# Patient Record
Sex: Male | Born: 1945
Health system: Southern US, Community
[De-identification: ages and names within clinical notes are randomized; demographics above are authoritative.]

## PROBLEM LIST (undated history)

## (undated) DIAGNOSIS — R001 Bradycardia, unspecified: Secondary | ICD-10-CM

## (undated) DIAGNOSIS — K219 Gastro-esophageal reflux disease without esophagitis: Secondary | ICD-10-CM

## (undated) DIAGNOSIS — N200 Calculus of kidney: Secondary | ICD-10-CM

## (undated) DIAGNOSIS — E785 Hyperlipidemia, unspecified: Secondary | ICD-10-CM

## (undated) DIAGNOSIS — F528 Other sexual dysfunction not due to a substance or known physiological condition: Secondary | ICD-10-CM

## (undated) DIAGNOSIS — I10 Essential (primary) hypertension: Secondary | ICD-10-CM

## (undated) DIAGNOSIS — M5030 Other cervical disc degeneration, unspecified cervical region: Secondary | ICD-10-CM

## (undated) HISTORY — DX: Calculus of kidney: N20.0

## (undated) HISTORY — PX: FINGER SURGERY: SHX640

## (undated) HISTORY — PX: HERNIA REPAIR: SHX51

## (undated) HISTORY — DX: Other sexual dysfunction not due to a substance or known physiological condition: F52.8

## (undated) HISTORY — DX: Other cervical disc degeneration, unspecified cervical region: M50.30

## (undated) HISTORY — PX: KNEE ARTHROSCOPY: SUR90

## (undated) HISTORY — DX: Bradycardia, unspecified: R00.1

## (undated) HISTORY — DX: Hyperlipidemia, unspecified: E78.5

## (undated) HISTORY — PX: BELPHAROPTOSIS REPAIR: SHX369

## (undated) HISTORY — DX: Gastro-esophageal reflux disease without esophagitis: K21.9

---

## 2001-10-29 ENCOUNTER — Ambulatory Visit (HOSPITAL_BASED_OUTPATIENT_CLINIC_OR_DEPARTMENT_OTHER): Admission: RE | Admit: 2001-10-29 | Discharge: 2001-10-29 | Payer: Self-pay | Admitting: Internal Medicine

## 2003-05-22 HISTORY — PX: COLONOSCOPY: SHX174

## 2004-10-18 ENCOUNTER — Ambulatory Visit: Payer: Self-pay | Admitting: Family Medicine

## 2004-11-06 ENCOUNTER — Ambulatory Visit: Payer: Self-pay | Admitting: Family Medicine

## 2005-10-25 ENCOUNTER — Ambulatory Visit: Payer: Self-pay | Admitting: Family Medicine

## 2005-11-01 ENCOUNTER — Ambulatory Visit: Payer: Self-pay | Admitting: Family Medicine

## 2006-10-29 ENCOUNTER — Ambulatory Visit: Payer: Self-pay | Admitting: Family Medicine

## 2006-10-29 LAB — CONVERTED CEMR LAB
AST: 23 units/L (ref 0–37)
Albumin: 3.9 g/dL (ref 3.5–5.2)
Alkaline Phosphatase: 54 units/L (ref 39–117)
BUN: 15 mg/dL (ref 6–23)
Basophils Absolute: 0 10*3/uL (ref 0.0–0.1)
Basophils Relative: 0.2 % (ref 0.0–1.0)
CO2: 29 meq/L (ref 19–32)
Chloride: 109 meq/L (ref 96–112)
Creatinine, Ser: 1.1 mg/dL (ref 0.4–1.5)
HCT: 40.5 % (ref 39.0–52.0)
Hemoglobin: 14.6 g/dL (ref 13.0–17.0)
LDL Cholesterol: 127 mg/dL — ABNORMAL HIGH (ref 0–99)
MCHC: 35.9 g/dL (ref 30.0–36.0)
Monocytes Absolute: 0.7 10*3/uL (ref 0.2–0.7)
Neutrophils Relative %: 50.4 % (ref 43.0–77.0)
Potassium: 4.3 meq/L (ref 3.5–5.1)
RBC: 4.39 M/uL (ref 4.22–5.81)
RDW: 12.1 % (ref 11.5–14.6)
Sodium: 145 meq/L (ref 135–145)
Total Bilirubin: 0.8 mg/dL (ref 0.3–1.2)
Total CHOL/HDL Ratio: 4.4
Total Protein: 6.9 g/dL (ref 6.0–8.3)
VLDL: 17 mg/dL (ref 0–40)

## 2006-11-08 ENCOUNTER — Ambulatory Visit: Payer: Self-pay | Admitting: Family Medicine

## 2007-03-25 ENCOUNTER — Telehealth: Payer: Self-pay | Admitting: Family Medicine

## 2007-03-26 ENCOUNTER — Telehealth: Payer: Self-pay | Admitting: Family Medicine

## 2007-11-07 ENCOUNTER — Ambulatory Visit: Payer: Self-pay | Admitting: Family Medicine

## 2007-11-07 LAB — CONVERTED CEMR LAB
ALT: 33 units/L (ref 0–53)
Alkaline Phosphatase: 52 units/L (ref 39–117)
Basophils Absolute: 0 10*3/uL (ref 0.0–0.1)
Bilirubin Urine: NEGATIVE
Bilirubin, Direct: 0.1 mg/dL (ref 0.0–0.3)
CO2: 30 meq/L (ref 19–32)
Calcium: 9 mg/dL (ref 8.4–10.5)
Chloride: 105 meq/L (ref 96–112)
Cholesterol: 162 mg/dL (ref 0–200)
HDL: 37.1 mg/dL — ABNORMAL LOW (ref >39.0)
Ketones, urine, test strip: NEGATIVE
LDL Cholesterol: 103 mg/dL — ABNORMAL HIGH (ref 0–99)
Lymphocytes Relative: 35.2 % (ref 12.0–46.0)
MCHC: 34.5 g/dL (ref 30.0–36.0)
Neutro Abs: 3 10*3/uL (ref 1.4–7.7)
Neutrophils Relative %: 52.2 % (ref 43.0–77.0)
Nitrite: NEGATIVE
Platelets: 252 10*3/uL (ref 150–400)
Potassium: 4.2 meq/L (ref 3.5–5.1)
Protein, U semiquant: NEGATIVE
RDW: 12 % (ref 11.5–14.6)
Sodium: 141 meq/L (ref 135–145)
TSH: 3.03 microintl units/mL (ref 0.35–5.50)
Total Bilirubin: 0.9 mg/dL (ref 0.3–1.2)
Total CHOL/HDL Ratio: 4.4
Triglycerides: 108 mg/dL (ref 0–149)
Urobilinogen, UA: 0.2
VLDL: 22 mg/dL (ref 0–40)

## 2007-11-12 ENCOUNTER — Encounter: Payer: Self-pay | Admitting: *Deleted

## 2007-11-13 ENCOUNTER — Encounter: Payer: Self-pay | Admitting: Family Medicine

## 2007-11-14 ENCOUNTER — Ambulatory Visit: Payer: Self-pay | Admitting: Family Medicine

## 2007-11-14 DIAGNOSIS — F528 Other sexual dysfunction not due to a substance or known physiological condition: Secondary | ICD-10-CM

## 2007-11-14 DIAGNOSIS — E785 Hyperlipidemia, unspecified: Secondary | ICD-10-CM | POA: Insufficient documentation

## 2007-11-14 HISTORY — DX: Hyperlipidemia, unspecified: E78.5

## 2007-11-14 HISTORY — DX: Other sexual dysfunction not due to a substance or known physiological condition: F52.8

## 2008-11-05 ENCOUNTER — Ambulatory Visit: Payer: Self-pay | Admitting: Family Medicine

## 2008-11-05 LAB — CONVERTED CEMR LAB
Albumin: 3.9 g/dL (ref 3.5–5.2)
Alkaline Phosphatase: 57 units/L (ref 39–117)
Basophils Absolute: 0 10*3/uL (ref 0.0–0.1)
Bilirubin, Direct: 0.1 mg/dL (ref 0.0–0.3)
Blood in Urine, dipstick: NEGATIVE
CO2: 29 meq/L (ref 19–32)
Calcium: 8.8 mg/dL (ref 8.4–10.5)
Creatinine, Ser: 1 mg/dL (ref 0.4–1.5)
Eosinophils Absolute: 0.1 10*3/uL (ref 0.0–0.7)
Glucose, Bld: 104 mg/dL — ABNORMAL HIGH (ref 70–99)
Glucose, Urine, Semiquant: NEGATIVE
HDL: 41.1 mg/dL (ref >39.00)
Ketones, urine, test strip: NEGATIVE
Lymphocytes Relative: 37.9 % (ref 12.0–46.0)
MCHC: 35.3 g/dL (ref 30.0–36.0)
Neutrophils Relative %: 48.7 % (ref 43.0–77.0)
Nitrite: NEGATIVE
Platelets: 217 10*3/uL (ref 150.0–400.0)
RDW: 12.1 % (ref 11.5–14.6)
Specific Gravity, Urine: 1.025
Triglycerides: 84 mg/dL (ref 0.0–149.0)
pH: 5

## 2008-11-15 ENCOUNTER — Ambulatory Visit: Payer: Self-pay | Admitting: Family Medicine

## 2008-11-15 DIAGNOSIS — M5030 Other cervical disc degeneration, unspecified cervical region: Secondary | ICD-10-CM

## 2008-11-15 DIAGNOSIS — R351 Nocturia: Secondary | ICD-10-CM | POA: Insufficient documentation

## 2008-11-15 HISTORY — DX: Other cervical disc degeneration, unspecified cervical region: M50.30

## 2009-11-09 ENCOUNTER — Ambulatory Visit: Payer: Self-pay | Admitting: Family Medicine

## 2009-11-09 LAB — CONVERTED CEMR LAB
Albumin: 3.8 g/dL (ref 3.5–5.2)
BUN: 13 mg/dL (ref 6–23)
Basophils Absolute: 0 10*3/uL (ref 0.0–0.1)
Bilirubin Urine: NEGATIVE
Blood in Urine, dipstick: NEGATIVE
CO2: 30 meq/L (ref 19–32)
Chloride: 109 meq/L (ref 96–112)
Cholesterol: 145 mg/dL (ref 0–200)
Eosinophils Absolute: 0.2 10*3/uL (ref 0.0–0.7)
GFR calc non Af Amer: 76.56 mL/min (ref >60)
Glucose, Bld: 100 mg/dL — ABNORMAL HIGH (ref 70–99)
Glucose, Urine, Semiquant: NEGATIVE
HCT: 38.5 % — ABNORMAL LOW (ref 39.0–52.0)
Lymphs Abs: 1.8 10*3/uL (ref 0.7–4.0)
MCHC: 35 g/dL (ref 30.0–36.0)
MCV: 95 fL (ref 78.0–100.0)
Monocytes Absolute: 0.7 10*3/uL (ref 0.1–1.0)
PSA: 0.45 ng/mL (ref 0.10–4.00)
Platelets: 248 10*3/uL (ref 150.0–400.0)
Potassium: 4.8 meq/L (ref 3.5–5.1)
Protein, U semiquant: NEGATIVE
RDW: 13.1 % (ref 11.5–14.6)
TSH: 3.39 microintl units/mL (ref 0.35–5.50)
Total Bilirubin: 0.8 mg/dL (ref 0.3–1.2)
VLDL: 20.6 mg/dL (ref 0.0–40.0)
pH: 5.5

## 2009-11-22 ENCOUNTER — Ambulatory Visit: Payer: Self-pay | Admitting: Family Medicine

## 2010-06-20 NOTE — Assessment & Plan Note (Signed)
Summary: CPX/NJR/PT [facility] FROM BMP/CJR   Vital Signs:  Patient profile:   65 year old male Height:      68.5 inches Weight:      222 pounds BMI:     33.38 Temp:     98.7 degrees F oral BP sitting:   130 / 90  (left arm) Cuff size:   regular  Vitals Entered By: Kern Reap CMA Duncan Dull) (November 22, 2009 2:17 PM) CC: cpx   CC:  cpx.  History of Present Illness: Michael Compton is a 65 year old, married male, nonsmoker, who comes in today for general physical examination and evaluation of hyperlipidemia and erectile dysfunction.  He is a history of hyperlipidemia, treated with Niaspan 500 mg daily and Zocor 40 mg nightly.  Fasting lipids are within goal with an LDL of 88.  He also has a history of erectile dysfunction for which he uses Viagra 100 mg p.r.n.  History 10 accurate dental care.  Colonoscopy and GI normal.  Tetanus 2005.  He recently had coxsackie viral infection  Allergies (verified): No Known Drug Allergies  Past History:  Past medical, surgical, family and social histories (including risk factors) reviewed, and no changes noted (except as noted below).  Past Medical History: Reviewed history from 11/14/2007 and no changes required. Hyperlipidemia right knee torn cartilage.  Surgical repair hernia repair erectile dysfunction  Family History: Reviewed history from 11/14/2007 and no changes required. father died 70, bladder cancer.  Smoker.  Mother is in his 30s alive and well.  Has had a total knee replacement otherwise in excellent health.  Two brothers one sister all 3 in excellent health  Social History: Reviewed history from 11/14/2007 and no changes required. 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 No apparent alopecia or balding. Eyes:  No corneal or conjunctival inflammation noted. EOMI. Perrla. Funduscopic exam benign, without hemorrhages, exudates or papilledema. Vision grossly normal. Ears:  External ear exam shows no significant lesions or deformities.  Otoscopic examination reveals clear canals, tympanic membranes are intact bilaterally without bulging, retraction, inflammation or discharge. Hearing is grossly normal bilaterally. Nose:  External nasal examination shows no deformity or inflammation. Nasal mucosa are pink and moist without lesions or exudates. Mouth:  Oral mucosa and oropharynx without lesions or exudates.  Teeth in good repair. Neck:  No deformities, masses, or tenderness noted. Chest Wall:  No deformities, masses, tenderness or gynecomastia noted. Breasts:  No masses or gynecomastia noted Lungs:  Normal respiratory effort, chest expands symmetrically. Lungs are clear to auscultation, no crackles or wheezes. Heart:  Normal rate and regular rhythm. S1 and S2 normal without gallop, murmur, click, rub or other extra sounds. Abdomen:  Bowel sounds positive,abdomen soft and non-tender without masses, organomegaly or hernias noted. Rectal:  No external abnormalities noted. Normal sphincter tone. No rectal masses or tenderness. Genitalia:  Testes bilaterally descended without nodularity, tenderness or masses. No scrotal masses or lesions. No penis lesions or urethral discharge. Prostate:  Prostate gland firm and smooth, no enlargement, nodularity, tenderness, mass, asymmetry or induration. Msk:  No deformity or scoliosis noted of thoracic or lumbar spine.   Pulses:  R and L carotid,radial,femoral,dorsalis pedis and posterior tibial pulses are full and equal bilaterally Extremities:  No clubbing, cyanosis, edema, or deformity noted with normal  full range of motion of all joints.   Neurologic:  No cranial nerve deficits noted. Station and  gait are normal. Plantar reflexes are down-going bilaterally. DTRs are symmetrical throughout. Sensory, motor and coordinative functions appear intact. Skin:  Intact without suspicious lesions or rashes Cervical Nodes:  No lymphadenopathy noted Axillary Nodes:  No palpable lymphadenopathy Inguinal Nodes:  No significant adenopathy Psych:  Cognition and judgment appear intact. Alert and cooperative with normal attention span and concentration. No apparent delusions, illusions, hallucinations   Impression & Recommendations:  Problem # 1:  ERECTILE DYSFUNCTION (ICD-302.72) Assessment Improved  His updated medication list for this problem includes:    Viagra 100 Mg Tabs (Sildenafil citrate) ..... Uad  Orders: Prescription Created Electronically [id number])  Problem # 2:  HYPERLIPIDEMIA (ICD-272.4) Assessment: Improved  His updated medication list for this problem includes:    Niaspan 500 Mg Tbcr (Niacin (antihyperlipidemic)) .[name]... 1 qam    Zocor 40 Mg Tabs (Simvastatin) .[name]... 1 tablet by mouth every night  Orders: Prescription Created Electronically [id number]) EKG w/ Interpretation (93000)  Problem # 3:  PHYSICAL EXAMINATION (ICD-V70.0) Assessment: Unchanged  Orders: Prescription Created Electronically [phone]) EKG w/ Interpretation (93000)  Complete Medication List: 1)  Niaspan 500 Mg Tbcr (Niacin (antihyperlipidemic)) .[name].. 1 qam 2)  Zocor 40 Mg Tabs (Simvastatin) .[name].. 1 tablet by mouth every night 3)  Multivitamins Tabs (Multiple vitamin) .... Once daily 4)  Adult Aspirin Ec Low Strength 81 Mg Tbec (Aspirin) .... Once daily 5)  L-lysine 500 Mg Tabs (Lysine) .... Once daily 6)  Viagra 100 Mg Tabs (Sildenafil citrate) .... Uad 7)  Omega-3 350 Mg Caps (Omega-3 fatty acids) .... Take one tab by mouth two times a day 8)  Glucosamine Sulfate 750 Mg Caps (Glucosamine sulfate) .... Take one tab by mouth two times a day  Patient Instructions: 1)  continue current medications.  Follow up  in one year Prescriptions: VIAGRA 100 MG  TABS (SILDENAFIL CITRATE) UAD  #6 x 11   Entered and Authorized by:   Roderick Pee MD   Signed by:   Roderick Pee MD on 11/22/2009   Method used:   Electronically to        [facility] [address] [phone]* (retail)       [address]       [place], [place]  [address]       Ph: [phone] or [phone]       Fax: [phone]   RxID:   [id number] ZOCOR 40 MG TABS (SIMVASTATIN) 1 tablet by mouth every night  #100 x 3   Entered and Authorized by:   Roderick Pee MD   Signed by:   Roderick Pee MD on 11/22/2009   Method used:   [facility] to        [facility] [address] [phone]* (retail)       [address]       [place], [place]  [address]       Ph: [phone] or [phone]       Fax: [phone]   RxID:   [id number] NIASPAN 500 MG  TBCR (NIACIN (ANTIHYPERLIPIDEMIC)) 1 qam  #100 Tablet x 3   Entered and Authorized by:   Roderick Pee MD   Signed by:   Roderick Pee MD on 11/22/2009   Method used:   [facility]ronically to  [facility] [address] [phone]* (retail)       [address]       [place], [place]  [address]       Ph: [phone] or [phone]       Fax: [phone]   RxID:   [id number]    Immunization History:  Tetanus/Td Immunization History:    Tetanus/Td:  historical (05/22/2003)

## 2010-06-22 ENCOUNTER — Encounter: Payer: Self-pay | Admitting: Family Medicine

## 2010-06-22 ENCOUNTER — Ambulatory Visit (INDEPENDENT_AMBULATORY_CARE_PROVIDER_SITE_OTHER): Payer: BC Managed Care – PPO | Admitting: Family Medicine

## 2010-06-22 VITALS — BP 122/84 | Temp 98.1°F | Ht 69.25 in | Wt 232.0 lb

## 2010-06-22 DIAGNOSIS — M79609 Pain in unspecified limb: Secondary | ICD-10-CM

## 2010-06-22 DIAGNOSIS — G620 Drug-induced polyneuropathy: Secondary | ICD-10-CM

## 2010-06-22 NOTE — Patient Instructions (Signed)
Stop the simvastatin, and take Motrin 600 mg twice daily with food.  If in two to 3 weeks.  He don't see any improvement.  Call

## 2010-06-22 NOTE — Progress Notes (Signed)
  Subjective:    Patient ID: Michael Compton, male    DOB: 04/01/1946, 65 y.o.   MRN: [id number]  HPI Michael Compton is a 65 -year-old, married male, nonsmoker, who comes in today for a 7 month history of bilateral lower extremity pain.  He states about 7 months ago he began having pain in his lower extremities.  He describes as a constant ache.  On a scale of one to 10 at the 4.  He also has two separate problems that are really not related.  One was plantar fasciitis, which resolved with symptomatic therapy, Motrin, stretching, etc. The other is he describes a syndrome of restless leg syndrome.  This been going on for many years.  He is otherwise in good health.     Review of Systems    musculoskeletal review of systems negative Objective:   Physical Exam    well-developed well-nourished, white male, in no acute distress.  Examination of both lower extremities shows the skin to be normal.  Pulses are normal.  Sensation reflexes, muscle strength, normal.  No palpable tenderness.    Assessment & Plan:

## 2010-06-23 ENCOUNTER — Encounter (INDEPENDENT_AMBULATORY_CARE_PROVIDER_SITE_OTHER): Payer: Self-pay | Admitting: *Deleted

## 2010-06-23 ENCOUNTER — Other Ambulatory Visit: Payer: BC Managed Care – PPO

## 2010-06-23 ENCOUNTER — Other Ambulatory Visit: Payer: Self-pay | Admitting: Family Medicine

## 2010-06-23 DIAGNOSIS — G620 Drug-induced polyneuropathy: Secondary | ICD-10-CM

## 2010-06-27 ENCOUNTER — Ambulatory Visit: Payer: Self-pay | Admitting: Family Medicine

## 2010-11-15 ENCOUNTER — Other Ambulatory Visit (INDEPENDENT_AMBULATORY_CARE_PROVIDER_SITE_OTHER): Payer: BC Managed Care – PPO

## 2010-11-15 DIAGNOSIS — Z0000 Encounter for general adult medical examination without abnormal findings: Secondary | ICD-10-CM

## 2010-11-15 LAB — BASIC METABOLIC PANEL
BUN: 14 mg/dL (ref 6–23)
Calcium: 8.6 mg/dL (ref 8.4–10.5)
GFR: 80.78 mL/min (ref >60.00)
Glucose, Bld: 96 mg/dL (ref 70–99)

## 2010-11-15 LAB — CBC WITH DIFFERENTIAL/PLATELET
Basophils Absolute: 0 10*3/uL (ref 0.0–0.1)
Lymphocytes Relative: 25.1 % (ref 12.0–46.0)
Monocytes Relative: 9.9 % (ref 3.0–12.0)
Neutrophils Relative %: 63.9 % (ref 43.0–77.0)
Platelets: 264 10*3/uL (ref 150.0–400.0)
RDW: 13 % (ref 11.5–14.6)
WBC: 6.8 10*3/uL (ref 4.5–10.5)

## 2010-11-15 LAB — TSH: TSH: 2.69 u[IU]/mL (ref 0.35–5.50)

## 2010-11-15 LAB — POCT URINALYSIS DIPSTICK
Blood, UA: NEGATIVE
Glucose, UA: NEGATIVE
Nitrite, UA: NEGATIVE
Urobilinogen, UA: 0.2

## 2010-11-15 LAB — HEPATIC FUNCTION PANEL
AST: 21 U/L (ref 0–37)
Alkaline Phosphatase: 63 U/L (ref 39–117)
Bilirubin, Direct: 0.1 mg/dL (ref 0.0–0.3)
Total Bilirubin: 0.7 mg/dL (ref 0.3–1.2)

## 2010-11-15 LAB — LIPID PANEL
Cholesterol: 203 mg/dL — ABNORMAL HIGH (ref 0–200)
Total CHOL/HDL Ratio: 5
VLDL: 16.6 mg/dL (ref 0.0–40.0)

## 2010-11-27 ENCOUNTER — Encounter: Payer: Self-pay | Admitting: Family Medicine

## 2010-11-27 ENCOUNTER — Ambulatory Visit (INDEPENDENT_AMBULATORY_CARE_PROVIDER_SITE_OTHER): Payer: BC Managed Care – PPO | Admitting: Family Medicine

## 2010-11-27 DIAGNOSIS — F528 Other sexual dysfunction not due to a substance or known physiological condition: Secondary | ICD-10-CM

## 2010-11-27 DIAGNOSIS — Z0000 Encounter for general adult medical examination without abnormal findings: Secondary | ICD-10-CM

## 2010-11-27 DIAGNOSIS — E785 Hyperlipidemia, unspecified: Secondary | ICD-10-CM

## 2010-11-27 DIAGNOSIS — M5030 Other cervical disc degeneration, unspecified cervical region: Secondary | ICD-10-CM

## 2010-11-27 MED ORDER — SILDENAFIL CITRATE 100 MG PO TABS: 100.0000 mg | ORAL_TABLET | Freq: Every day | ORAL | Status: DC | PRN

## 2010-11-27 NOTE — Patient Instructions (Signed)
Continue your current medications.  Follow-up in one year, sooner if any problems.  We do recommend the shingles of vaccination

## 2010-11-27 NOTE — Progress Notes (Signed)
  Subjective:    Patient ID: Michael Compton, male    DOB: 09-24-1945, 65 y.o.   MRN: [id number]  HPI Michael Compton is a 65 year old, married man nonsmoker, who comes in today for general physical examination because of a history of hyperlipidemia erectile dysfunction and cervical disk disease.  His hyperlipidemia street with diet, exercise, and Niaspan 500 mg daily.  Lipids are at goal.  We stopped the Zocor because of side effects.  He's been off and that now for 6 months.  He uses Viagra p.r.n. For ED.  Currently seeing your to pedis or lumbar spine pain.  He is on Mobic 7.5 mg daily.  He gets routine eye care, dental care, colonoscopy, 2005, normal, tetanus, 05, information given on shingles.  This year.  He had lid surgery, right, left eye for lid lag, recovered.  No sequelae   Review of Systems  Constitutional: Negative.   HENT: Negative.   Eyes: Negative.   Respiratory: Negative.   Cardiovascular: Negative.   Gastrointestinal: Negative.   Genitourinary: Negative.   Musculoskeletal: Negative.   Skin: Negative.   Neurological: Negative.   Hematological: Negative.   Psychiatric/Behavioral: Negative.        Objective:   Physical Exam  Constitutional: He is oriented to person, place, and time. He appears well-developed and well-nourished.  HENT:  Head: Normocephalic and atraumatic.  Right Ear: External ear normal.  Left Ear: External ear normal.  Nose: Nose normal.  Mouth/Throat: Oropharynx is clear and moist.  Eyes: Conjunctivae and EOM are normal. Pupils are equal, round, and reactive to light.  Neck: Normal range of motion. Neck supple. No JVD present. No tracheal deviation present. No thyromegaly present.  Cardiovascular: Normal rate, regular rhythm, normal heart sounds and intact distal pulses.  Exam reveals no gallop and no friction rub.   No murmur heard. Pulmonary/Chest: Effort normal and breath sounds normal. No stridor. No respiratory distress. He has no wheezes. He  has no rales. He exhibits no tenderness.  Abdominal: Soft. Bowel sounds are normal. He exhibits no distension and no mass. There is no tenderness. There is no rebound and no guarding.  Genitourinary: Rectum normal, prostate normal and penis normal. Guaiac negative stool. No penile tenderness.  Musculoskeletal: Normal range of motion. He exhibits no edema and no tenderness.  Lymphadenopathy:    He has no cervical adenopathy.  Neurological: He is alert and oriented to person, place, and time. He has normal reflexes. No cranial nerve deficit. He exhibits normal muscle tone.  Skin: Skin is warm and dry. No rash noted. No erythema. No pallor.       Well-healed scars right and left upper eyes from previous surgery and may  Psychiatric: He has a normal mood and affect. His behavior is normal. Judgment and thought content normal.          Assessment & Plan:  Healthy male.  Hyperlipidemia controlled with diet, exercise, and Niaspan continue that option.  Erectile dysfunction continue Viagra p.r.n.  Return one year, sooner if any problems.  Recommend shingles

## 2010-12-25 ENCOUNTER — Ambulatory Visit (INDEPENDENT_AMBULATORY_CARE_PROVIDER_SITE_OTHER): Payer: BC Managed Care – PPO | Admitting: Family Medicine

## 2010-12-25 DIAGNOSIS — Z23 Encounter for immunization: Secondary | ICD-10-CM

## 2010-12-25 DIAGNOSIS — Z0000 Encounter for general adult medical examination without abnormal findings: Secondary | ICD-10-CM

## 2011-06-28 DIAGNOSIS — H25099 Other age-related incipient cataract, unspecified eye: Secondary | ICD-10-CM | POA: Diagnosis not present

## 2011-08-28 DIAGNOSIS — J00 Acute nasopharyngitis [common cold]: Secondary | ICD-10-CM | POA: Diagnosis not present

## 2011-08-28 DIAGNOSIS — J209 Acute bronchitis, unspecified: Secondary | ICD-10-CM | POA: Diagnosis not present

## 2011-08-29 DIAGNOSIS — T1500XA Foreign body in cornea, unspecified eye, initial encounter: Secondary | ICD-10-CM | POA: Diagnosis not present

## 2011-10-25 ENCOUNTER — Telehealth: Payer: Self-pay | Admitting: Family Medicine

## 2011-10-25 DIAGNOSIS — E785 Hyperlipidemia, unspecified: Secondary | ICD-10-CM

## 2011-10-25 DIAGNOSIS — F528 Other sexual dysfunction not due to a substance or known physiological condition: Secondary | ICD-10-CM

## 2011-10-25 DIAGNOSIS — Z0000 Encounter for general adult medical examination without abnormal findings: Secondary | ICD-10-CM

## 2011-10-25 NOTE — Telephone Encounter (Signed)
Orders entered

## 2011-10-25 NOTE — Telephone Encounter (Signed)
This pt is going to [facility] lab tomorrow, 6/7 for CPX labs. Can you please put the orders in? Thanks!!

## 2011-10-26 ENCOUNTER — Other Ambulatory Visit (INDEPENDENT_AMBULATORY_CARE_PROVIDER_SITE_OTHER): Payer: BC Managed Care – PPO

## 2011-10-26 DIAGNOSIS — F528 Other sexual dysfunction not due to a substance or known physiological condition: Secondary | ICD-10-CM | POA: Diagnosis not present

## 2011-10-26 DIAGNOSIS — Z0000 Encounter for general adult medical examination without abnormal findings: Secondary | ICD-10-CM

## 2011-10-26 DIAGNOSIS — E785 Hyperlipidemia, unspecified: Secondary | ICD-10-CM | POA: Diagnosis not present

## 2011-10-26 LAB — LIPID PANEL
Cholesterol: 208 mg/dL — ABNORMAL HIGH (ref 0–200)
HDL: 43.3 mg/dL (ref >39.00)
Total CHOL/HDL Ratio: 5
Triglycerides: 75 mg/dL (ref 0.0–149.0)

## 2011-10-26 LAB — CBC WITH DIFFERENTIAL/PLATELET
Basophils Absolute: 0 10*3/uL (ref 0.0–0.1)
Eosinophils Absolute: 0.1 10*3/uL (ref 0.0–0.7)
HCT: 42.7 % (ref 39.0–52.0)
Lymphs Abs: 2 10*3/uL (ref 0.7–4.0)
MCV: 94.5 fl (ref 78.0–100.0)
Monocytes Absolute: 0.6 10*3/uL (ref 0.1–1.0)
Platelets: 255 10*3/uL (ref 150.0–400.0)
RDW: 12.9 % (ref 11.5–14.6)

## 2011-10-26 LAB — TSH: TSH: 2.89 u[IU]/mL (ref 0.35–5.50)

## 2011-10-26 LAB — BASIC METABOLIC PANEL
Calcium: 9 mg/dL (ref 8.4–10.5)
Chloride: 109 mEq/L (ref 96–112)
Creatinine, Ser: 0.9 mg/dL (ref 0.4–1.5)
Sodium: 140 mEq/L (ref 135–145)

## 2011-10-26 LAB — URINALYSIS
Hgb urine dipstick: NEGATIVE
Total Protein, Urine: NEGATIVE
Urine Glucose: NEGATIVE

## 2011-10-26 LAB — PSA: PSA: 0.7 ng/mL (ref 0.10–4.00)

## 2011-10-26 LAB — HEPATIC FUNCTION PANEL
ALT: 19 U/L (ref 0–53)
AST: 23 U/L (ref 0–37)
Total Bilirubin: 0.8 mg/dL (ref 0.3–1.2)

## 2011-10-29 ENCOUNTER — Other Ambulatory Visit: Payer: BC Managed Care – PPO

## 2011-11-14 ENCOUNTER — Encounter: Payer: Self-pay | Admitting: Family Medicine

## 2011-11-14 ENCOUNTER — Ambulatory Visit (INDEPENDENT_AMBULATORY_CARE_PROVIDER_SITE_OTHER): Payer: BC Managed Care – PPO | Admitting: Family Medicine

## 2011-11-14 VITALS — BP 130/80 | Temp 98.5°F | Ht 68.5 in | Wt 227.0 lb

## 2011-11-14 DIAGNOSIS — E785 Hyperlipidemia, unspecified: Secondary | ICD-10-CM

## 2011-11-14 DIAGNOSIS — Z23 Encounter for immunization: Secondary | ICD-10-CM

## 2011-11-14 DIAGNOSIS — Z0000 Encounter for general adult medical examination without abnormal findings: Secondary | ICD-10-CM

## 2011-11-14 DIAGNOSIS — F528 Other sexual dysfunction not due to a substance or known physiological condition: Secondary | ICD-10-CM

## 2011-11-14 DIAGNOSIS — M5030 Other cervical disc degeneration, unspecified cervical region: Secondary | ICD-10-CM

## 2011-11-14 MED ORDER — MELOXICAM 7.5 MG PO TABS: 7.5000 mg | ORAL_TABLET | Freq: Every day | ORAL | Status: DC

## 2011-11-14 MED ORDER — SILDENAFIL CITRATE 100 MG PO TABS: 100.0000 mg | ORAL_TABLET | Freq: Every day | ORAL | Status: DC | PRN

## 2011-11-14 NOTE — Patient Instructions (Signed)
Continue your current medications  Followup in 1 year sooner if any problems 

## 2011-11-14 NOTE — Progress Notes (Signed)
  Subjective:    Patient ID: Michael Compton, male    DOB: 07/25/1945, 66 y.o.   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   HENT: Negative.   Eyes: Negative.   Respiratory: Negative.   Cardiovascular: Negative.   Gastrointestinal: Negative.   Genitourinary: Negative.   Musculoskeletal: Negative.   Skin: Negative.   Neurological: Negative.   Hematological: Negative.   Psychiatric/Behavioral: Negative.        Objective:   Physical Exam  Constitutional: He is oriented to person, place, and time. He appears well-developed and well-nourished.  HENT:  Head: Normocephalic and atraumatic.  Right Ear: External ear normal.  Left Ear: External ear normal.  Nose: Nose normal.  Mouth/Throat: Oropharynx is clear and moist.  Eyes: Conjunctivae and EOM are normal. Pupils are equal, round, and reactive to light.  Neck: Normal range of motion. Neck supple. No JVD present. No tracheal deviation present. No thyromegaly present.  Cardiovascular: Normal rate, regular rhythm, normal heart sounds and intact distal pulses.  Exam reveals no gallop and no friction rub.   No murmur heard. Pulmonary/Chest: Effort normal and breath sounds normal. No stridor. No respiratory distress. He has no wheezes. He has no rales. He exhibits no tenderness.  Abdominal: Soft. Bowel sounds are normal. He exhibits no  distension and no mass. There is no tenderness. There is no rebound and no guarding.  Genitourinary: Rectum normal, prostate normal and penis normal. Guaiac negative stool. No penile tenderness.  Musculoskeletal: Normal range of motion. He exhibits no edema and no tenderness.  Lymphadenopathy:    He has no cervical adenopathy.  Neurological: He is alert and oriented to person, place, and time. He has normal reflexes. No cranial nerve deficit. He exhibits normal muscle tone.  Skin: Skin is warm and dry. No rash noted. No erythema. No pallor.  Psychiatric: He has a normal mood and affect. His behavior is normal. Judgment and thought content normal.          Assessment & Plan:  Healthy male  Hyperlipidemia continue Niaspan  Osteoarthritis continue Mobic  Erectile dysfunction continue Viagra

## 2012-11-17 ENCOUNTER — Ambulatory Visit (INDEPENDENT_AMBULATORY_CARE_PROVIDER_SITE_OTHER): Payer: Medicare Other | Admitting: Family Medicine

## 2012-11-17 ENCOUNTER — Encounter: Payer: Self-pay | Admitting: Family Medicine

## 2012-11-17 VITALS — BP 140/90 | Temp 99.0°F | Ht 69.5 in | Wt 228.0 lb

## 2012-11-17 DIAGNOSIS — E785 Hyperlipidemia, unspecified: Secondary | ICD-10-CM | POA: Diagnosis not present

## 2012-11-17 DIAGNOSIS — N401 Enlarged prostate with lower urinary tract symptoms: Secondary | ICD-10-CM

## 2012-11-17 DIAGNOSIS — Z0000 Encounter for general adult medical examination without abnormal findings: Secondary | ICD-10-CM | POA: Diagnosis not present

## 2012-11-17 DIAGNOSIS — F528 Other sexual dysfunction not due to a substance or known physiological condition: Secondary | ICD-10-CM | POA: Diagnosis not present

## 2012-11-17 LAB — CBC WITH DIFFERENTIAL/PLATELET
Basophils Relative: 0.4 % (ref 0.0–3.0)
Eosinophils Relative: 1.1 % (ref 0.0–5.0)
HCT: 41.3 % (ref 39.0–52.0)
Hemoglobin: 14.1 g/dL (ref 13.0–17.0)
Lymphs Abs: 1.9 10*3/uL (ref 0.7–4.0)
Monocytes Relative: 10.9 % (ref 3.0–12.0)
Neutro Abs: 3.4 10*3/uL (ref 1.4–7.7)
RDW: 12.8 % (ref 11.5–14.6)
WBC: 6.1 10*3/uL (ref 4.5–10.5)

## 2012-11-17 LAB — PSA: PSA: 0.69 ng/mL (ref 0.10–4.00)

## 2012-11-17 LAB — HEPATIC FUNCTION PANEL
AST: 24 U/L (ref 0–37)
Total Bilirubin: 0.8 mg/dL (ref 0.3–1.2)

## 2012-11-17 LAB — POCT URINALYSIS DIPSTICK
Bilirubin, UA: NEGATIVE
Blood, UA: NEGATIVE
Ketones, UA: NEGATIVE
Spec Grav, UA: >=1.03
pH, UA: 5

## 2012-11-17 LAB — LIPID PANEL
Total CHOL/HDL Ratio: 5
Triglycerides: 99 mg/dL (ref 0.0–149.0)

## 2012-11-17 LAB — LDL CHOLESTEROL, DIRECT: Direct LDL: 167 mg/dL

## 2012-11-17 LAB — BASIC METABOLIC PANEL
BUN: 18 mg/dL (ref 6–23)
Chloride: 109 mEq/L (ref 96–112)
Glucose, Bld: 121 mg/dL — ABNORMAL HIGH (ref 70–99)
Potassium: 5.2 mEq/L — ABNORMAL HIGH (ref 3.5–5.1)

## 2012-11-17 MED ORDER — SILDENAFIL CITRATE 100 MG PO TABS: 100.0000 mg | ORAL_TABLET | Freq: Every day | ORAL | Status: DC | PRN

## 2012-11-17 NOTE — Progress Notes (Signed)
  Subjective:    Patient ID: Michael Compton, male    DOB: 1946-02-20, 67 y.o.   MRN: [id number]  HPI [name] is a 67 year old married male does not smoke cigarettes,,,,,, smokes 2 cigars when he is playing a round of golf,,,,,, who comes in today for a Medicare wellness examination because of a history of hyperlipidemia and erectile dysfunction and a new problem of reflux esophagitis  He's had reflux esophagitis for the past 2 years. He says it doesn't bother him at night. During the day he can even do something as simple as walk his dog and he feels a burning sensation in his mid sternal area. He denies any cardiac symptoms. He has no difficulty swallowing. He's tried over-the-counter medications to no avail.  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   HENT: Negative.   Eyes: Negative.   Respiratory: Negative.   Cardiovascular: Negative.   Gastrointestinal: Negative.   Genitourinary: Negative.   Musculoskeletal: Negative.   Skin: Negative.   Neurological: Negative.   Psychiatric/Behavioral: Negative.        Objective:   Physical Exam  Constitutional: He is oriented to person, place, and time. He appears well-developed and well-nourished.  HENT:  Head: Normocephalic and atraumatic.  Right Ear: External ear normal.  Left Ear: External ear normal.  Nose: Nose normal.  Mouth/Throat: Oropharynx is clear and moist.  Eyes: Conjunctivae and EOM are normal. Pupils are equal, round, and reactive to light.  Neck: Normal range of motion. Neck supple. No JVD present. No tracheal deviation  present. No thyromegaly present.  Cardiovascular: Normal rate, regular rhythm, normal heart sounds and intact distal pulses.  Exam reveals no gallop and no friction rub.   No murmur heard. Pulmonary/Chest: Effort normal and breath sounds normal. No stridor. No respiratory distress. He has no wheezes. He has no rales. He exhibits no tenderness.  Abdominal: Soft. Bowel sounds are normal. He exhibits no distension and no mass. There is no tenderness. There is no rebound and no guarding.  Genitourinary: Rectum normal, prostate normal and penis normal. Guaiac negative stool. No penile tenderness.  Musculoskeletal: Normal range of motion. He exhibits no edema and no tenderness.  Lymphadenopathy:    He has no cervical adenopathy.  Neurological: He is alert and oriented to person, place, and time. He has normal reflexes. No cranial nerve deficit. He exhibits normal muscle tone.  Skin: Skin is warm and dry. No rash noted. No erythema. No pallor.  Total body skin exam normal  Psychiatric: He has a normal mood and affect. His behavior is normal. Judgment and thought content normal.          Assessment & Plan:  Healthy male  Mild hyperlipidemia continue OTC niacin  Erectile dysfunction continue Viagra 100 mg when necessary    Reflux esophagitis is getting worse. We will place him on anti-reflex program if after 3-4 weeks we see no improvement or he has difficulty swallowing and we'll get him to GI for further evaluation

## 2012-11-17 NOTE — Patient Instructions (Signed)
For the reflux I would recommend the following............ No nicotine and caffeine peppermint alcohol or aspirin or aspirin products.  Only take Tylenol  OTC Prilosec,,,,,,,,,, one tablet twice daily  Followup in 4 weeks  [email or web address]

## 2012-12-09 ENCOUNTER — Telehealth: Payer: Self-pay | Admitting: Family Medicine

## 2012-12-09 DIAGNOSIS — E785 Hyperlipidemia, unspecified: Secondary | ICD-10-CM

## 2012-12-09 NOTE — Telephone Encounter (Signed)
Michael Compton, pt was on Dr Nelida Meuse schedule for next wk as a 15mo recheck on heartburn. He said it has cleared up. However, based on his 6/30 labs, Dr Tawanna Cooler wanted him to have repeat lipids in 3mos. We went ahead an set him up for lipid panel on 10/30 (can you put order in?) and then pt said if, based on those results, Dr Tawanna Cooler wants to see him, he can make OV appt then. Is this ok?

## 2012-12-09 NOTE — Telephone Encounter (Signed)
Yes.  Lab order placed.

## 2012-12-15 ENCOUNTER — Ambulatory Visit: Payer: Medicare Other | Admitting: Family Medicine

## 2013-02-17 ENCOUNTER — Other Ambulatory Visit (INDEPENDENT_AMBULATORY_CARE_PROVIDER_SITE_OTHER): Payer: Medicare Other

## 2013-02-17 DIAGNOSIS — E785 Hyperlipidemia, unspecified: Secondary | ICD-10-CM | POA: Diagnosis not present

## 2013-02-17 LAB — LDL CHOLESTEROL, DIRECT: Direct LDL: 153.9 mg/dL

## 2013-03-06 ENCOUNTER — Telehealth: Payer: Self-pay | Admitting: Family Medicine

## 2013-03-06 NOTE — Telephone Encounter (Signed)
Pt would like blood work results °

## 2013-03-10 ENCOUNTER — Encounter: Payer: Self-pay | Admitting: *Deleted

## 2013-03-10 NOTE — Telephone Encounter (Signed)
Patient is aware of lab results.

## 2013-03-26 ENCOUNTER — Other Ambulatory Visit: Payer: Self-pay

## 2013-04-21 DIAGNOSIS — J309 Allergic rhinitis, unspecified: Secondary | ICD-10-CM | POA: Diagnosis not present

## 2013-04-21 DIAGNOSIS — R05 Cough: Secondary | ICD-10-CM | POA: Diagnosis not present

## 2013-09-08 ENCOUNTER — Encounter: Payer: Self-pay | Admitting: Family Medicine

## 2013-09-08 ENCOUNTER — Ambulatory Visit (INDEPENDENT_AMBULATORY_CARE_PROVIDER_SITE_OTHER): Payer: Medicare Other | Admitting: Family Medicine

## 2013-09-08 DIAGNOSIS — L820 Inflamed seborrheic keratosis: Secondary | ICD-10-CM | POA: Diagnosis not present

## 2013-09-08 NOTE — Patient Instructions (Signed)
Return when necessary 

## 2013-09-08 NOTE — Progress Notes (Signed)
   Subjective:    Patient ID: Michael Compton, male    DOB: 12-22-1945, 68 y.o.   MRN: [id number]  HPI Michael Compton is a 68 year old male who comes in today with about a lesion on his right lower extremity  He has a crusty brown reason is right lower extremity is now as big as a quarter.  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

## 2013-10-21 ENCOUNTER — Encounter: Payer: Self-pay | Admitting: Internal Medicine

## 2013-10-30 DIAGNOSIS — M1710 Unilateral primary osteoarthritis, unspecified knee: Secondary | ICD-10-CM | POA: Diagnosis not present

## 2013-12-16 ENCOUNTER — Encounter: Payer: Medicare Other | Admitting: Family Medicine

## 2014-01-05 ENCOUNTER — Encounter: Payer: Self-pay | Admitting: Family Medicine

## 2014-01-05 ENCOUNTER — Ambulatory Visit (INDEPENDENT_AMBULATORY_CARE_PROVIDER_SITE_OTHER): Payer: Medicare Other | Admitting: Family Medicine

## 2014-01-05 VITALS — BP 120/80 | Temp 98.3°F | Ht 68.25 in | Wt 226.0 lb

## 2014-01-05 DIAGNOSIS — Z23 Encounter for immunization: Secondary | ICD-10-CM | POA: Diagnosis not present

## 2014-01-05 DIAGNOSIS — E785 Hyperlipidemia, unspecified: Secondary | ICD-10-CM | POA: Diagnosis not present

## 2014-01-05 DIAGNOSIS — F528 Other sexual dysfunction not due to a substance or known physiological condition: Secondary | ICD-10-CM

## 2014-01-05 DIAGNOSIS — K2100 Gastro-esophageal reflux disease with esophagitis, without bleeding: Secondary | ICD-10-CM

## 2014-01-05 LAB — POCT URINALYSIS DIPSTICK
Bilirubin, UA: NEGATIVE
GLUCOSE UA: NEGATIVE
Ketones, UA: NEGATIVE
Leukocytes, UA: NEGATIVE
Nitrite, UA: NEGATIVE
PH UA: 5
Protein, UA: NEGATIVE
RBC UA: NEGATIVE
SPEC GRAV UA: 1.02
UROBILINOGEN UA: 0.2

## 2014-01-05 LAB — LIPID PANEL
CHOL/HDL RATIO: 6
Cholesterol: 227 mg/dL — ABNORMAL HIGH (ref 0–200)
HDL: 39.2 mg/dL (ref >39.00)
LDL Cholesterol: 155 mg/dL — ABNORMAL HIGH (ref 0–99)
NONHDL: 187.8
Triglycerides: 166 mg/dL — ABNORMAL HIGH (ref 0.0–149.0)
VLDL: 33.2 mg/dL (ref 0.0–40.0)

## 2014-01-05 LAB — BASIC METABOLIC PANEL
BUN: 14 mg/dL (ref 6–23)
CALCIUM: 9 mg/dL (ref 8.4–10.5)
CO2: 27 meq/L (ref 19–32)
Chloride: 106 mEq/L (ref 96–112)
Creatinine, Ser: 1.1 mg/dL (ref 0.4–1.5)
GFR: 73.14 mL/min (ref >60.00)
GLUCOSE: 103 mg/dL — AB (ref 70–99)
POTASSIUM: 4.3 meq/L (ref 3.5–5.1)
Sodium: 140 mEq/L (ref 135–145)

## 2014-01-05 LAB — HEPATIC FUNCTION PANEL
ALBUMIN: 3.8 g/dL (ref 3.5–5.2)
ALK PHOS: 62 U/L (ref 39–117)
ALT: 33 U/L (ref 0–53)
AST: 31 U/L (ref 0–37)
BILIRUBIN TOTAL: 0.9 mg/dL (ref 0.2–1.2)
Bilirubin, Direct: 0.2 mg/dL (ref 0.0–0.3)
Total Protein: 6.7 g/dL (ref 6.0–8.3)

## 2014-01-05 LAB — CBC WITH DIFFERENTIAL/PLATELET
BASOS PCT: 0.4 % (ref 0.0–3.0)
Basophils Absolute: 0 10*3/uL (ref 0.0–0.1)
EOS ABS: 0.1 10*3/uL (ref 0.0–0.7)
Eosinophils Relative: 1.5 % (ref 0.0–5.0)
HEMATOCRIT: 41.1 % (ref 39.0–52.0)
HEMOGLOBIN: 13.8 g/dL (ref 13.0–17.0)
LYMPHS ABS: 1.8 10*3/uL (ref 0.7–4.0)
Lymphocytes Relative: 27.3 % (ref 12.0–46.0)
MCHC: 33.6 g/dL (ref 30.0–36.0)
MCV: 96.3 fl (ref 78.0–100.0)
Monocytes Absolute: 0.6 10*3/uL (ref 0.1–1.0)
Monocytes Relative: 9.2 % (ref 3.0–12.0)
NEUTROS ABS: 4 10*3/uL (ref 1.4–7.7)
Neutrophils Relative %: 61.6 % (ref 43.0–77.0)
Platelets: 274 10*3/uL (ref 150.0–400.0)
RBC: 4.27 Mil/uL (ref 4.22–5.81)
RDW: 13 % (ref 11.5–15.5)
WBC: 6.5 10*3/uL (ref 4.0–10.5)

## 2014-01-05 LAB — TSH: TSH: 2.86 u[IU]/mL (ref 0.35–4.50)

## 2014-01-05 LAB — PSA: PSA: 0.64 ng/mL (ref 0.10–4.00)

## 2014-01-05 NOTE — Patient Instructions (Signed)
Continue your current health habits  Remember to walk 30 minutes daily  I will call you I gets her lab work back  Remember the sunscreens

## 2014-01-05 NOTE — Progress Notes (Signed)
   Subjective:    Patient ID: Michael Compton, male    DOB: 09/28/1945, 68 y.o.   MRN: [id number]  HPI Michael Compton is a 68 year old married male nonsmoker who comes in today for evaluation of hyperlipidemia, reflux esophagitis, and erectile dysfunction  For hyperlipidemia he follows a diet and takes niacin check labs today  He's desire 100 mg when necessary  He takes over-the-counter omeprazole for chronic reflux  He does not get routine eye care recommended yearly exams. He does get regular dental care. Due for colonoscopy this year.  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   HENT: Negative.   Eyes: Negative.   Respiratory: Negative.   Cardiovascular: Negative.   Gastrointestinal: Negative.   Genitourinary: Negative.   Musculoskeletal: Negative.   Skin: Negative.   Neurological: Negative.   Psychiatric/Behavioral: Negative.        Objective:   Physical Exam  Nursing note and vitals reviewed. Constitutional: He is oriented to person, place, and time. He appears well-developed and well-nourished.  HENT:  Head: Normocephalic and atraumatic.  Right Ear: External ear normal.  Left Ear: External ear normal.  Nose: Nose normal.  Mouth/Throat: Oropharynx is clear and moist.  Eyes: Conjunctivae and EOM are normal. Pupils are equal, round, and reactive to light.  Neck: Normal range of motion. Neck supple. No JVD present. No tracheal deviation present. No thyromegaly present.  Cardiovascular: Normal rate, regular rhythm, normal heart sounds and intact distal pulses.  Exam reveals no gallop and no friction rub.   No murmur heard. No carotid  artery bruits peripheral pulses 2+ and symmetrical  Pulmonary/Chest: Effort normal and breath sounds normal. No stridor. No respiratory distress. He has no wheezes. He has no rales. He exhibits no tenderness.  Abdominal: Soft. Bowel sounds are normal. He exhibits no distension and no mass. There is no tenderness. There is no rebound and no guarding.  Genitourinary: Rectum normal, prostate normal and penis normal. Guaiac negative stool. No penile tenderness.  Musculoskeletal: Normal range of motion. He exhibits no edema and no tenderness.  Lymphadenopathy:    He has no cervical adenopathy.  Neurological: He is alert and oriented to person, place, and time. He has normal reflexes. No cranial nerve deficit. He exhibits normal muscle tone.  Skin: Skin is warm and dry. No rash noted. No erythema. No pallor.  Total body skin exam normal  Psychiatric: He has a normal mood and affect. His behavior is normal. Judgment and thought content normal.          Assessment & Plan:  Healthy male  History of hyperlipidemia,,, check labs  Erectile revatil....Marland KitchenMarland KitchenRevatio........[name]  Reflux esophagitis continue Prilosec

## 2014-01-05 NOTE — Progress Notes (Signed)
Pre visit review using our clinic review tool, if applicable. No additional management support is needed unless otherwise documented below in the visit note. 

## 2014-05-04 DIAGNOSIS — H11001 Unspecified pterygium of right eye: Secondary | ICD-10-CM | POA: Diagnosis not present

## 2014-05-04 DIAGNOSIS — H52223 Regular astigmatism, bilateral: Secondary | ICD-10-CM | POA: Diagnosis not present

## 2014-05-04 DIAGNOSIS — H524 Presbyopia: Secondary | ICD-10-CM | POA: Diagnosis not present

## 2014-05-04 DIAGNOSIS — H2513 Age-related nuclear cataract, bilateral: Secondary | ICD-10-CM | POA: Diagnosis not present

## 2014-05-04 DIAGNOSIS — H5202 Hypermetropia, left eye: Secondary | ICD-10-CM | POA: Diagnosis not present

## 2014-05-21 HISTORY — PX: COLONOSCOPY: SHX174

## 2014-05-21 HISTORY — PX: POLYPECTOMY: SHX149

## 2014-08-24 ENCOUNTER — Encounter: Payer: Self-pay | Admitting: Internal Medicine

## 2014-10-07 ENCOUNTER — Ambulatory Visit (AMBULATORY_SURGERY_CENTER): Payer: Self-pay | Admitting: *Deleted

## 2014-10-07 VITALS — Ht 69.5 in | Wt 230.0 lb

## 2014-10-07 DIAGNOSIS — Z1211 Encounter for screening for malignant neoplasm of colon: Secondary | ICD-10-CM

## 2014-10-07 NOTE — Progress Notes (Signed)
Patient denies any allergies to eggs or soy. Patient denies any problems with anesthesia/sedation. Patient denies any oxygen use at home and does not take any diet/weight loss medications. EMMI education assisgned to patient on colonoscopy, this was explained and instructions given to patient. 

## 2014-10-21 ENCOUNTER — Encounter: Payer: Medicare Other | Admitting: Internal Medicine

## 2014-10-27 ENCOUNTER — Ambulatory Visit (AMBULATORY_SURGERY_CENTER): Payer: Medicare Other | Admitting: Internal Medicine

## 2014-10-27 ENCOUNTER — Encounter: Payer: Self-pay | Admitting: Internal Medicine

## 2014-10-27 VITALS — BP 123/74 | HR 43 | Temp 97.6°F | Resp 20 | Ht 69.0 in | Wt 230.0 lb

## 2014-10-27 DIAGNOSIS — Z1211 Encounter for screening for malignant neoplasm of colon: Secondary | ICD-10-CM

## 2014-10-27 DIAGNOSIS — D124 Benign neoplasm of descending colon: Secondary | ICD-10-CM

## 2014-10-27 MED ORDER — SODIUM CHLORIDE 0.9 % IV SOLN
500.0000 mL | INTRAVENOUS | Status: DC
Start: 2014-10-27 — End: 2014-10-28

## 2014-10-27 NOTE — Progress Notes (Signed)
Called to room to assist during endoscopic procedure.  Patient ID and intended procedure confirmed with present staff. Received instructions for my participation in the procedure from the performing physician.Called to room to assist during endoscopic procedure.  Patient ID and intended procedure confirmed with present staff. Received instructions for my participation in the procedure from the performing physician. 

## 2014-10-27 NOTE — Patient Instructions (Signed)
YOU HAD AN ENDOSCOPIC PROCEDURE TODAY AT [facility]:   Refer to the procedure report that was given to you for any specific questions about what was found during the examination.  If the procedure report does not answer your questions, please call your gastroenterologist to clarify.  If you requested that your care partner not be given the details of your procedure findings, then the procedure report has been included in a sealed envelope for you to review at your convenience later.  YOU SHOULD EXPECT: Some feelings of bloating in the abdomen. Passage of more gas than usual.  Walking can help get rid of the air that was put into your GI tract during the procedure and reduce the bloating. If you had a lower endoscopy (such as a colonoscopy or flexible sigmoidoscopy) you may notice spotting of blood in your stool or on the toilet paper. If you underwent a bowel prep for your procedure, you may not have a normal bowel movement for a few days.  Please Note:  You might notice some irritation and congestion in your nose or some drainage.  This is from the oxygen used during your procedure.  There is no need for concern and it should clear up in a day or so.  SYMPTOMS TO REPORT IMMEDIATELY:   Following lower endoscopy (colonoscopy or flexible sigmoidoscopy):  Excessive amounts of blood in the stool  Significant tenderness or worsening of abdominal pains  Swelling of the abdomen that is new, acute  Fever of 100F or higher  For urgent or emergent issues, a gastroenterologist can be reached at any hour by calling [phone].   DIET: Your first meal following the procedure should be a small meal and then it is ok to progress to your normal diet. Heavy or fried foods are harder to digest and may make you feel nauseous or bloated.  Likewise, meals heavy in dairy and vegetables can increase bloating.  Drink plenty of fluids but you should avoid alcoholic beverages for 24  hours.  ACTIVITY:  You should plan to take it easy for the rest of today and you should NOT DRIVE or use heavy machinery until tomorrow (because of the sedation medicines used during the test).    FOLLOW UP: Our staff will call the number listed on your records the next business day following your procedure to check on you and address any questions or concerns that you may have regarding the information given to you following your procedure. If we do not reach you, we will leave a message.  However, if you are feeling well and you are not experiencing any problems, there is no need to return our call.  We will assume that you have returned to your regular daily activities without incident.  If any biopsies were taken you will be contacted by phone or by letter within the next 1-3 weeks.  Please call us at [phone] if you have not heard about the biopsies in 3 weeks.    SIGNATURES/CONFIDENTIALITY: You and/or your care partner have signed paperwork which will be entered into your electronic medical record.  These signatures attest to the fact that that the information above on your After Visit Summary has been reviewed and is understood.  Full responsibility of the confidentiality of this discharge information lies with you and/or your care-partner.  Please review polyp, diverticulosis, and high fiber handouts provided. Next colonoscopy determined by pathology results.

## 2014-10-27 NOTE — Op Note (Signed)
[facility]  [address] [place], [address]   COLONOSCOPY PROCEDURE REPORT  PATIENT: Michael Compton, Michael Compton  MR#: [id number] BIRTHDATE: September 16, 1945 , 69  yrs. old GENDER: male ENDOSCOPIST: Lafayette Dragon, MD REFERRED DV:VOHYWVP Todd, MD PROCEDURE DATE:  10/27/2014 PROCEDURE:   Colonoscopy, screening and Colonoscopy with cold biopsy polypectomy First Screening Colonoscopy - Avg.  risk and is 50 yrs.  old or older - No.  Prior Negative Screening - Now for repeat screening. 10 or more years since last screening  History of Adenoma - Now for follow-up colonoscopy & has been > or = to 3 yrs.  N/A  Polyps removed today? Yes ASA CLASS:   Class II INDICATIONS:Screening for colonic neoplasia, Colorectal Neoplasm Risk Assessment for this procedure is average risk, and prior colonoscopy in August 2006 was normal. MEDICATIONS: Monitored anesthesia care and Propofol 250 mg IV  DESCRIPTION OF PROCEDURE:   After the risks benefits and alternatives of the procedure were thoroughly explained, informed consent was obtained.  The digital rectal exam revealed no abnormalities of the rectum.   The LB [id number]-[id number]  endoscope was introduced through the anus and advanced to the cecum, which was identified by both the appendix and ileocecal valve. No adverse events experienced.   The quality of the prep was good.  (MoviPrep was used)  The instrument was then slowly withdrawn as the colon was fully examined. Estimated blood loss is zero unless otherwise noted in this procedure report.      COLON FINDINGS: A firm sessile polyp measuring 4 mm in size was found in the descending colon.  A polypectomy was performed with cold forceps.  The resection was complete, the polyp tissue was completely retrieved and sent to histology.   There was mild diverticulosis noted in the descending colon.  Retroflexed views revealed no abnormalities. The time to cecum = 3.43 Withdrawal time = 9.28   The  scope was withdrawn and the procedure completed. COMPLICATIONS: There were no immediate complications.  ENDOSCOPIC IMPRESSION: 1.   Sessile polyp was found in the descending colon; polypectomy was performed with cold forceps 2.   Mild diverticulosis was noted in the descending colon  RECOMMENDATIONS: 1.  Await pathology results 2.  High fiber diet Recall colonoscopy pending path report  eSigned:  Lafayette Dragon, MD 10/27/2014 10:37 AM   cc:   PATIENT NAME:  Blanchard, [place] MR#: [id number]

## 2014-10-27 NOTE — Progress Notes (Addendum)
Pt arrived at [facility] with a 16 oz cup of black coffee, having finished all except an ounce.  Randye Lobo CMA made J. Monday CRNA made aware and states pt must wait 2 hours to have procedure done.  Pt becomes angry and states, "I want to see my instructions where I was told not to drink after 500."  He states, "forget it,  I just want to reschedule."  I told pt the reason why he had to be NPO at least 2 hours before procedure; so that he doesn't cough during the procedure, increasing the rick for aspiration. Then, he decides he wants to go home and come back at 9:30 a.m. For his procedure.  I offered to show him his instructions, where is not to drink after 500 but he leaves the admitting area and states, "I'll just be back at 9:30."  Pt leaves before I can tell him to not drink anything between now and 9:30 a.m. Pt unarrived per [facility].  Dr. Olevia Perches is aware of the situation.

## 2014-10-27 NOTE — Progress Notes (Signed)
A/ox3, pleased with MAC, report to RN 

## 2014-10-28 ENCOUNTER — Telehealth: Payer: Self-pay | Admitting: *Deleted

## 2014-10-28 NOTE — Telephone Encounter (Signed)
  Follow up Call-  Call back number 10/27/2014  Post procedure Call Back phone  # [phone]  Permission to leave phone message Yes     Patient questions:  Do you have a fever, pain , or abdominal swelling? No. Pain Score  0 *  Have you tolerated food without any problems? Yes.    Have you been able to return to your normal activities? Yes.    Do you have any questions about your discharge instructions: Diet   No. Medications  No. Follow up visit  No.  Do you have questions or concerns about your Care? No.  Actions: * If pain score is 4 or above: No action needed, pain <4.

## 2014-11-01 ENCOUNTER — Encounter: Payer: Self-pay | Admitting: Internal Medicine

## 2015-02-08 ENCOUNTER — Encounter: Payer: Self-pay | Admitting: Family Medicine

## 2015-02-08 ENCOUNTER — Ambulatory Visit (INDEPENDENT_AMBULATORY_CARE_PROVIDER_SITE_OTHER): Payer: Medicare Other | Admitting: Family Medicine

## 2015-02-08 VITALS — BP 130/90 | Temp 98.8°F | Ht 69.0 in | Wt 229.0 lb

## 2015-02-08 DIAGNOSIS — F528 Other sexual dysfunction not due to a substance or known physiological condition: Secondary | ICD-10-CM | POA: Diagnosis not present

## 2015-02-08 DIAGNOSIS — K2100 Gastro-esophageal reflux disease with esophagitis, without bleeding: Secondary | ICD-10-CM

## 2015-02-08 DIAGNOSIS — E785 Hyperlipidemia, unspecified: Secondary | ICD-10-CM | POA: Diagnosis not present

## 2015-02-08 DIAGNOSIS — N529 Male erectile dysfunction, unspecified: Secondary | ICD-10-CM | POA: Diagnosis not present

## 2015-02-08 DIAGNOSIS — Z23 Encounter for immunization: Secondary | ICD-10-CM | POA: Diagnosis not present

## 2015-02-08 DIAGNOSIS — R351 Nocturia: Secondary | ICD-10-CM

## 2015-02-08 DIAGNOSIS — N401 Enlarged prostate with lower urinary tract symptoms: Secondary | ICD-10-CM | POA: Diagnosis not present

## 2015-02-08 DIAGNOSIS — Z0000 Encounter for general adult medical examination without abnormal findings: Secondary | ICD-10-CM | POA: Diagnosis not present

## 2015-02-08 LAB — BASIC METABOLIC PANEL
BUN: 11 mg/dL (ref 6–23)
CHLORIDE: 105 meq/L (ref 96–112)
CO2: 25 meq/L (ref 19–32)
CREATININE: 0.88 mg/dL (ref 0.40–1.50)
Calcium: 8.9 mg/dL (ref 8.4–10.5)
GFR: 91.35 mL/min (ref >60.00)
GLUCOSE: 98 mg/dL (ref 70–99)
POTASSIUM: 4.4 meq/L (ref 3.5–5.1)
Sodium: 139 mEq/L (ref 135–145)

## 2015-02-08 LAB — CBC WITH DIFFERENTIAL/PLATELET
BASOS ABS: 0 10*3/uL (ref 0.0–0.1)
BASOS PCT: 0.5 % (ref 0.0–3.0)
EOS ABS: 0.1 10*3/uL (ref 0.0–0.7)
Eosinophils Relative: 0.8 % (ref 0.0–5.0)
HEMATOCRIT: 41.6 % (ref 39.0–52.0)
Hemoglobin: 14.1 g/dL (ref 13.0–17.0)
LYMPHS PCT: 29.7 % (ref 12.0–46.0)
Lymphs Abs: 2 10*3/uL (ref 0.7–4.0)
MCHC: 34 g/dL (ref 30.0–36.0)
MCV: 94.6 fl (ref 78.0–100.0)
MONO ABS: 0.6 10*3/uL (ref 0.1–1.0)
Monocytes Relative: 9.2 % (ref 3.0–12.0)
NEUTROS ABS: 4 10*3/uL (ref 1.4–7.7)
Neutrophils Relative %: 59.8 % (ref 43.0–77.0)
PLATELETS: 286 10*3/uL (ref 150.0–400.0)
RBC: 4.4 Mil/uL (ref 4.22–5.81)
RDW: 12.8 % (ref 11.5–15.5)
WBC: 6.7 10*3/uL (ref 4.0–10.5)

## 2015-02-08 LAB — POCT URINALYSIS DIPSTICK
Bilirubin, UA: NEGATIVE
Glucose, UA: NEGATIVE
KETONES UA: NEGATIVE
Leukocytes, UA: NEGATIVE
Nitrite, UA: NEGATIVE
PROTEIN UA: NEGATIVE
RBC UA: NEGATIVE
SPEC GRAV UA: 1.02
UROBILINOGEN UA: 0.2
pH, UA: 5.5

## 2015-02-08 LAB — LIPID PANEL
CHOL/HDL RATIO: 5
Cholesterol: 208 mg/dL — ABNORMAL HIGH (ref 0–200)
HDL: 39.1 mg/dL (ref >39.00)
LDL CALC: 136 mg/dL — AB (ref 0–99)
NonHDL: 169.01
Triglycerides: 167 mg/dL — ABNORMAL HIGH (ref 0.0–149.0)
VLDL: 33.4 mg/dL (ref 0.0–40.0)

## 2015-02-08 LAB — HEPATIC FUNCTION PANEL
ALBUMIN: 3.8 g/dL (ref 3.5–5.2)
ALT: 21 U/L (ref 0–53)
AST: 18 U/L (ref 0–37)
Alkaline Phosphatase: 71 U/L (ref 39–117)
BILIRUBIN TOTAL: 0.7 mg/dL (ref 0.2–1.2)
Bilirubin, Direct: 0.1 mg/dL (ref 0.0–0.3)
Total Protein: 6.6 g/dL (ref 6.0–8.3)

## 2015-02-08 LAB — PSA: PSA: 0.69 ng/mL (ref 0.10–4.00)

## 2015-02-08 LAB — TSH: TSH: 2.5 u[IU]/mL (ref 0.35–4.50)

## 2015-02-08 MED ORDER — SILDENAFIL CITRATE 100 MG PO TABS: 100.0000 mg | ORAL_TABLET | Freq: Every day | ORAL | Status: DC | PRN

## 2015-02-08 NOTE — Progress Notes (Signed)
   Subjective:    Patient ID: Michael Compton, male    DOB: 08-May-1946, 69 y.o.   MRN: [id number]  HPI [name] is a 69 year old married male nonsmoker......[name] [place] veteran..... Who comes in today for general physical examination  He uses Viagra 100 mg when necessary for ED  He takes Motrin 200 mg 4 times a day when necessary for osteoarthritis  He also takes omeprazole at the direction of his gastroenterologist 20 mg daily for chronic reflux esophagitis.  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   HENT: Negative.   Eyes: Negative.   Respiratory: Negative.   Cardiovascular: Negative.   Gastrointestinal: Negative.   Endocrine: Negative.   Genitourinary: Negative.   Musculoskeletal: Negative.   Skin: Negative.   Allergic/Immunologic: Negative.   Neurological: Negative.   Hematological: Negative.   Psychiatric/Behavioral: Negative.        Objective:   Physical Exam  Constitutional: He is oriented to person, place, and time. He appears well-developed and well-nourished.  HENT:  Head: Normocephalic and atraumatic.  Right Ear: External ear normal.  Left Ear: External ear normal.  Nose: Nose normal.  Mouth/Throat: Oropharynx is clear and moist.  Eyes: Conjunctivae and EOM are normal. Pupils are equal, round, and reactive to light.  Neck: Normal range of motion. Neck supple. No JVD present. No tracheal deviation present. No thyromegaly present.  Cardiovascular: Normal rate, regular rhythm, normal heart sounds and intact distal pulses.  Exam reveals no gallop and no friction rub.   No murmur heard. No  carotid bruits nor aortic bruits. Peripheral pulses 2+ and symmetrical.  He brings in a ultrasound that he had done at the [place] which shows a normal aorta some dilatation of the iliacs otherwise within normal limits  Pulmonary/Chest: Effort normal and breath sounds normal. No stridor. No respiratory distress. He has no wheezes. He has no rales. He exhibits no tenderness.  Abdominal: Soft. Bowel sounds are normal. He exhibits no distension and no mass. There is no tenderness. There is no rebound and no guarding.  Genitourinary: Rectum normal and penis normal. Guaiac negative stool. No penile tenderness.  1+ symmetrical nonnodular BPH,,,,, asymptomatic  Musculoskeletal: Normal range of motion. He exhibits no edema or tenderness.  Lymphadenopathy:    He has no cervical adenopathy.  Neurological: He is alert and oriented to person, place, and time. He has normal reflexes. No cranial nerve deficit. He exhibits normal muscle tone.  Skin: Skin is warm and dry. No rash noted. No erythema. No pallor.  Total body skin exam normal except for a abnormal lesion back right T10 area........... advised to return for removal  Psychiatric: He has a normal mood and affect. His behavior is normal. Judgment and thought content normal.  Nursing note and vitals reviewed.         Assessment & Plan:  Healthy male  History of ED mild.............[name] Viagra when necessary  Reflux esophagitis chronic........ continue omeprazole OTC at the direction of his gastroenterologist.

## 2015-02-08 NOTE — Progress Notes (Signed)
Pre visit review using our clinic review tool, if applicable. No additional management support is needed unless otherwise documented below in the visit note. 

## 2015-02-08 NOTE — Patient Instructions (Addendum)
Continue your current medications  Remember to walk 30 minutes daily  Follow-up in one year sooner if any problems  [employer].........[name] our new adult nurse practitioner from [place]  Return sometime in the next week or 2 to remove the lesion on your back  The cheapest place to get the Viagra is......... [place] [email or web address]..........[name] or [employer]

## 2015-02-15 ENCOUNTER — Encounter: Payer: Self-pay | Admitting: Family Medicine

## 2015-02-15 ENCOUNTER — Ambulatory Visit (INDEPENDENT_AMBULATORY_CARE_PROVIDER_SITE_OTHER): Payer: Medicare Other | Admitting: Family Medicine

## 2015-02-15 VITALS — BP 140/100 | HR 53 | Temp 98.5°F | Ht 69.0 in | Wt 229.8 lb

## 2015-02-15 DIAGNOSIS — L821 Other seborrheic keratosis: Secondary | ICD-10-CM | POA: Diagnosis not present

## 2015-02-15 DIAGNOSIS — L570 Actinic keratosis: Secondary | ICD-10-CM

## 2015-02-15 DIAGNOSIS — L814 Other melanin hyperpigmentation: Secondary | ICD-10-CM | POA: Diagnosis not present

## 2015-02-15 NOTE — Progress Notes (Signed)
Pre visit review using our clinic review tool, if applicable. No additional management support is needed unless otherwise documented below in the visit note. 

## 2015-02-15 NOTE — Progress Notes (Signed)
   Subjective:    Patient ID: CALDWELL KRONENBERGER, male    DOB: [birth year], 69 y.o.   MRN: [id number]  HPI [name] is a 69 year old married male nonsmoker who comes in today for removal of the lesion on his back  He has a white pedunculated lesion right mid back.  8 mm x 8 mm his measurements  After informed consent the patient was taken the treatment room. The lesion was anesthetized with 1% Xylocaine with epinephrine. It was removed with 2 mm margins. The base was cauterized Band-Aid was applied. The lesion was sent for pathologic analysis  Clinically it appears to be an actinic keratosis   Review of Systems Review of systems otherwise negative    Objective:   Physical Exam  Well-developed well-nourished male no acute distress lesion as above      Assessment & Plan:  80mm by 56mm lesion right mid back removed.......... clinically it appears to be an actinic keratosis....... path pending.

## 2015-02-15 NOTE — Patient Instructions (Signed)
Remove the Band-Aid tomorrow leave the area open to the air  Within 2 weeks we will call you the report

## 2015-04-26 DIAGNOSIS — H11001 Unspecified pterygium of right eye: Secondary | ICD-10-CM | POA: Diagnosis not present

## 2015-04-26 DIAGNOSIS — H2513 Age-related nuclear cataract, bilateral: Secondary | ICD-10-CM | POA: Diagnosis not present

## 2015-04-26 DIAGNOSIS — H524 Presbyopia: Secondary | ICD-10-CM | POA: Diagnosis not present

## 2015-04-26 DIAGNOSIS — H5203 Hypermetropia, bilateral: Secondary | ICD-10-CM | POA: Diagnosis not present

## 2015-04-26 DIAGNOSIS — H52223 Regular astigmatism, bilateral: Secondary | ICD-10-CM | POA: Diagnosis not present

## 2015-06-22 DIAGNOSIS — M7062 Trochanteric bursitis, left hip: Secondary | ICD-10-CM | POA: Diagnosis not present

## 2015-11-11 DIAGNOSIS — K1321 Leukoplakia of oral mucosa, including tongue: Secondary | ICD-10-CM | POA: Diagnosis not present

## 2016-03-19 DIAGNOSIS — H5213 Myopia, bilateral: Secondary | ICD-10-CM | POA: Diagnosis not present

## 2016-03-19 DIAGNOSIS — H11003 Unspecified pterygium of eye, bilateral: Secondary | ICD-10-CM | POA: Diagnosis not present

## 2016-03-19 DIAGNOSIS — H524 Presbyopia: Secondary | ICD-10-CM | POA: Diagnosis not present

## 2016-03-19 DIAGNOSIS — H11153 Pinguecula, bilateral: Secondary | ICD-10-CM | POA: Diagnosis not present

## 2016-03-19 DIAGNOSIS — H52223 Regular astigmatism, bilateral: Secondary | ICD-10-CM | POA: Diagnosis not present

## 2016-04-16 ENCOUNTER — Encounter: Payer: Self-pay | Admitting: Family Medicine

## 2016-04-16 ENCOUNTER — Ambulatory Visit (INDEPENDENT_AMBULATORY_CARE_PROVIDER_SITE_OTHER): Payer: Medicare Other | Admitting: Family Medicine

## 2016-04-16 VITALS — BP 138/88 | HR 53 | Temp 97.5°F | Ht <= 58 in | Wt 224.4 lb

## 2016-04-16 DIAGNOSIS — R351 Nocturia: Secondary | ICD-10-CM | POA: Diagnosis not present

## 2016-04-16 DIAGNOSIS — E785 Hyperlipidemia, unspecified: Secondary | ICD-10-CM | POA: Diagnosis not present

## 2016-04-16 DIAGNOSIS — K2100 Gastro-esophageal reflux disease with esophagitis, without bleeding: Secondary | ICD-10-CM

## 2016-04-16 DIAGNOSIS — N401 Enlarged prostate with lower urinary tract symptoms: Secondary | ICD-10-CM | POA: Diagnosis not present

## 2016-04-16 DIAGNOSIS — Z23 Encounter for immunization: Secondary | ICD-10-CM | POA: Diagnosis not present

## 2016-04-16 DIAGNOSIS — F528 Other sexual dysfunction not due to a substance or known physiological condition: Secondary | ICD-10-CM

## 2016-04-16 DIAGNOSIS — N529 Male erectile dysfunction, unspecified: Secondary | ICD-10-CM

## 2016-04-16 LAB — CBC WITH DIFFERENTIAL/PLATELET
Basophils Absolute: 0 10*3/uL (ref 0.0–0.1)
Basophils Relative: 0.4 % (ref 0.0–3.0)
EOS PCT: 0.9 % (ref 0.0–5.0)
Eosinophils Absolute: 0.1 10*3/uL (ref 0.0–0.7)
HEMATOCRIT: 44.3 % (ref 39.0–52.0)
Hemoglobin: 14.9 g/dL (ref 13.0–17.0)
LYMPHS ABS: 2.6 10*3/uL (ref 0.7–4.0)
LYMPHS PCT: 30.5 % (ref 12.0–46.0)
MCHC: 33.7 g/dL (ref 30.0–36.0)
MCV: 92.1 fl (ref 78.0–100.0)
MONOS PCT: 10 % (ref 3.0–12.0)
Monocytes Absolute: 0.9 10*3/uL (ref 0.1–1.0)
NEUTROS ABS: 5 10*3/uL (ref 1.4–7.7)
NEUTROS PCT: 58.2 % (ref 43.0–77.0)
Platelets: 334 10*3/uL (ref 150.0–400.0)
RBC: 4.81 Mil/uL (ref 4.22–5.81)
RDW: 13.1 % (ref 11.5–15.5)
WBC: 8.5 10*3/uL (ref 4.0–10.5)

## 2016-04-16 LAB — POCT URINALYSIS DIPSTICK
BILIRUBIN UA: NEGATIVE
Blood, UA: NEGATIVE
GLUCOSE UA: NEGATIVE
KETONES UA: NEGATIVE
LEUKOCYTES UA: NEGATIVE
Nitrite, UA: NEGATIVE
PH UA: 5
Protein, UA: NEGATIVE
Spec Grav, UA: 1.02
Urobilinogen, UA: 0.2

## 2016-04-16 LAB — HEPATIC FUNCTION PANEL
ALBUMIN: 4.1 g/dL (ref 3.5–5.2)
ALT: 19 U/L (ref 0–53)
AST: 18 U/L (ref 0–37)
Alkaline Phosphatase: 71 U/L (ref 39–117)
Bilirubin, Direct: 0.1 mg/dL (ref 0.0–0.3)
TOTAL PROTEIN: 6.8 g/dL (ref 6.0–8.3)
Total Bilirubin: 0.7 mg/dL (ref 0.2–1.2)

## 2016-04-16 LAB — LIPID PANEL
CHOL/HDL RATIO: 6
Cholesterol: 248 mg/dL — ABNORMAL HIGH (ref 0–200)
HDL: 43.9 mg/dL (ref >39.00)
LDL Cholesterol: 175 mg/dL — ABNORMAL HIGH (ref 0–99)
NONHDL: 203.85
TRIGLYCERIDES: 144 mg/dL (ref 0.0–149.0)
VLDL: 28.8 mg/dL (ref 0.0–40.0)

## 2016-04-16 LAB — TSH: TSH: 2.62 u[IU]/mL (ref 0.35–4.50)

## 2016-04-16 LAB — BASIC METABOLIC PANEL
BUN: 19 mg/dL (ref 6–23)
CHLORIDE: 103 meq/L (ref 96–112)
CO2: 30 meq/L (ref 19–32)
Calcium: 9.2 mg/dL (ref 8.4–10.5)
Creatinine, Ser: 1.23 mg/dL (ref 0.40–1.50)
GFR: 61.86 mL/min (ref >60.00)
GLUCOSE: 97 mg/dL (ref 70–99)
POTASSIUM: 4.5 meq/L (ref 3.5–5.1)
SODIUM: 140 meq/L (ref 135–145)

## 2016-04-16 LAB — PSA: PSA: 1.45 ng/mL (ref 0.10–4.00)

## 2016-04-16 MED ORDER — SILDENAFIL CITRATE 100 MG PO TABS: 100.0000 mg | ORAL_TABLET | Freq: Every day | ORAL | 11 refills | Status: DC | PRN

## 2016-04-16 MED ORDER — SILDENAFIL CITRATE 20 MG PO TABS: ORAL_TABLET | ORAL | 11 refills | Status: DC

## 2016-04-16 NOTE — Patient Instructions (Signed)
You  lost 6 pounds since last year............ continue the carbohydrate free diet and exercise  Continue current medications  The generic Viagra,,,,,,,,, cheapest is [employer]

## 2016-04-16 NOTE — Progress Notes (Signed)
Pre visit review using our clinic review tool, if applicable. No additional management support is needed unless otherwise documented below in the visit note. 

## 2016-04-16 NOTE — Progress Notes (Signed)
Michael Compton is a 70 year old married male nonsmoker..... Except [place]... Who comes in today for evaluation because of history of hypertension, erectile dysfunction, reflux esophagitis  He takes Hydrocort thiazide 25 mg daily for hypertension. This was given to him at the [place] about a year ago. He also did an ultrasound of his aorta which was normal. His blood pressure here today is 130/88. He's not checking his blood pressure at home. Would like to see his blood pressure in the low 130s and low 80s. Advised to check his blood pressure 3 times weekly at home and get back with [place] in 6 weeks if it's not at goal  He takes Prilosec OTC when necessary for reflux  He uses Viagra when necessary for ED. He has BPH and nocturia 2  He gets routine eye care, dental care, colonoscopy 2016 showed a polyp. 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 Advised to continue diet exercise and weight loss. Also advised to take an aspirin tablet daily.  EKG was done because of a history of hypertension. It's normal and unchanged from previous cardiograms.  14 point review of systems otherwise negative  Physical evaluation  BP 138/88   Pulse (!) 53   Temp 97.5 F (36.4 C) (Oral)   Ht 4' (1.219 m)   Wt 224 lb 6.4 oz (101.8 kg)   BMI 68.48 kg/m  Examination HEENT were negative neck was supple no adenopathy thyroid normal no carotid bruits cardiopulmonary exam normal abdominal exam normal genitalia normal circumcised male rectum normal stool guaiac-negative prostate smooth nonnodular 2+ BPH. Extremities normal skin normal peripheral pulses normal  Impression #1..... Hypertension question at goal.... BP check daily  for 6 weeks return if not at goal......[name] 135/85 or less  #2 mild ED..... Generic Viagra  #3 reflux esophagitis.... Sporadically OTC Prilosec when necessary  #4 obesity..... Continue weight loss program

## 2016-04-17 ENCOUNTER — Other Ambulatory Visit: Payer: Self-pay | Admitting: Family Medicine

## 2016-04-17 DIAGNOSIS — E785 Hyperlipidemia, unspecified: Secondary | ICD-10-CM

## 2016-04-17 MED ORDER — SIMVASTATIN 20 MG PO TABS: 20.0000 mg | ORAL_TABLET | Freq: Every day | ORAL | 3 refills | Status: DC

## 2016-04-17 NOTE — Progress Notes (Signed)
Rx printed and mailed to pt so that he can give to [place].

## 2016-04-20 ENCOUNTER — Telehealth: Payer: Self-pay

## 2016-04-20 NOTE — Telephone Encounter (Signed)
Sent MyChart email to pt.

## 2016-04-27 ENCOUNTER — Other Ambulatory Visit: Payer: Self-pay

## 2017-02-06 NOTE — Progress Notes (Deleted)
Subjective:   Michael Compton is a 71 y.o. male who presents for Medicare Annual/Subsequent preventive examination.  The Patient was informed that the wellness visit is to identify future health risk and educate and initiate measures that can reduce risk for increased disease through the lifespan.    Annual Wellness Assessment  Reports health as   Preventive Screening -Counseling & Management  Medicare Annual Preventive Care Visit - Subsequent Last OV Nov 2017 and will see Dr. Sherren Mocha 03/2017   PSA 03/2016 Colonoscopy 10/2014 AAA 12/2014  Health Maintenance Due  Topic Date Due  . Hepatitis C Screening  Dec 25, 1945  . INFLUENZA VACCINE  12/19/2016     Describes Health as poor, fair, good or great?   VS reviewed;   Diet   BMI  Exercise  Dental  Stressors:   Sleep patterns:   Pain?    Cardiac Risk Factors Addressed Hyperlipidemia - chol 248; HDL 43; LDL 175; trig 144 Diabetes neg  Obesity  Advanced Directives  @Patient  Care Team: Dorena Cookey, MD as PCP - General               Objective:    Vitals: There were no vitals taken for this visit.  There is no height or weight on file to calculate BMI.  Tobacco History  Smoking Status  . Current Some Day Smoker  . Types: Cigars  Smokeless Tobacco  . Never Used     Ready to quit: Not Answered Counseling given: Not Answered   Past Medical History:  Diagnosis Date  . [place] DISEASE, CERVICAL 11/15/2008  . ERECTILE DYSFUNCTION 11/14/2007  . HYPERLIPIDEMIA 11/14/2007   Past Surgical History:  Procedure Laterality Date  . BELPHAROPTOSIS REPAIR    . FINGER SURGERY    . HERNIA REPAIR    . KNEE ARTHROSCOPY     Family History  Problem Relation Age of Onset  . Cancer Father   . Colon cancer Neg Hx    History  Sexual Activity  . Sexual activity: Not on file    Outpatient Encounter Prescriptions as of 02/07/2017  Medication Sig  . acetaminophen (TYLENOL) 325 MG tablet Take 325 mg by mouth  every 6 (six) hours as needed.  [name] glucosamine-chondroitin 500-400 MG tablet Take 2 tablets by mouth daily.   . hydrochlorothiazide (HYDRODIURIL) 25 MG tablet Take 25 mg by mouth daily.  [name] ibuprofen (ADVIL,MOTRIN) 200 MG tablet Take 200 mg by mouth every 6 (six) hours as needed.    Javier Docker Oil 300 MG CAPS Take by mouth.  . Lysine 1000 MG TABS Take 1,000 mg by mouth daily.  . NON FORMULARY Memory support - brainstrong  . Nutritional Supplements (CHOLESTEROL DEFENSE PO) Take 1 tablet by mouth daily.  [name] omeprazole (PRILOSEC) 20 MG capsule Take 20 mg by mouth daily.  . sildenafil (REVATIO) 20 MG tablet 1-2 tablets 2-3 hours prior to sex  . sildenafil (VIAGRA) 100 MG tablet Take 1 tablet (100 mg total) by mouth daily as needed.  . simvastatin (ZOCOR) 20 MG tablet Take 1 tablet (20 mg total) by mouth at bedtime.   No facility-administered encounter medications on file as of 02/07/2017.     Activities of Daily Living No flowsheet data found.  Patient Care Team: Dorena Cookey, MD as PCP - General   Assessment:    *** Exercise Activities and Dietary recommendations    Goals    None     Fall Risk Fall Risk  04/27/2016 02/08/2015 01/05/2014  Falls in the past year? No No No  Comment Emmi Telephone Survey: data to providers prior to load - -   Depression Screen PHQ 2/9 Scores 02/08/2015 01/05/2014  PHQ - 2 Score 0 0    Cognitive Function        Immunization History  Administered Date(s) Administered  . Influenza, High Dose Seasonal PF 02/08/2015, 04/16/2016  . Pneumococcal Conjugate-13 01/05/2014  . Pneumococcal Polysaccharide-23 11/14/2011  . Td 05/22/2003  . Tdap 01/05/2014  . Zoster 12/25/2010   Screening Tests Health Maintenance  Topic Date Due  . Hepatitis C Screening  [birth year]  . INFLUENZA VACCINE  12/19/2016  . COLONOSCOPY  10/27/2019  . TETANUS/TDAP  01/06/2024  . PNA vac Low Risk Adult  Completed      Plan:   ***  I have personally reviewed and noted the  following in the patient's chart:   . Medical and social history . Use of alcohol, tobacco or illicit drugs  . Current medications and supplements . Functional ability and status . Nutritional status . Physical activity . Advanced directives . List of other physicians . Hospitalizations, surgeries, and ER visits in previous 12 months . Vitals . Screenings to include cognitive, depression, and falls . Referrals and appointments  In addition, I have reviewed and discussed with patient certain preventive protocols, quality metrics, and best practice recommendations. A written personalized care plan for preventive services as well as general preventive health recommendations were provided to patient.     Wynetta Fines, RN  02/06/2017

## 2017-02-07 ENCOUNTER — Ambulatory Visit: Payer: Medicare Other

## 2017-02-08 ENCOUNTER — Encounter: Payer: Self-pay | Admitting: Family Medicine

## 2017-02-21 DIAGNOSIS — H524 Presbyopia: Secondary | ICD-10-CM | POA: Diagnosis not present

## 2017-02-21 DIAGNOSIS — H2513 Age-related nuclear cataract, bilateral: Secondary | ICD-10-CM | POA: Diagnosis not present

## 2017-02-21 DIAGNOSIS — H5203 Hypermetropia, bilateral: Secondary | ICD-10-CM | POA: Diagnosis not present

## 2017-02-21 DIAGNOSIS — H52223 Regular astigmatism, bilateral: Secondary | ICD-10-CM | POA: Diagnosis not present

## 2017-02-21 DIAGNOSIS — H11003 Unspecified pterygium of eye, bilateral: Secondary | ICD-10-CM | POA: Diagnosis not present

## 2017-04-09 ENCOUNTER — Ambulatory Visit: Payer: Medicare Other | Admitting: Family Medicine

## 2017-05-06 ENCOUNTER — Encounter: Payer: Self-pay | Admitting: Family Medicine

## 2017-05-06 ENCOUNTER — Ambulatory Visit: Payer: PPO | Admitting: Family Medicine

## 2017-05-06 VITALS — BP 124/84 | HR 57 | Temp 98.7°F | Ht 69.0 in | Wt 227.0 lb

## 2017-05-06 DIAGNOSIS — Z23 Encounter for immunization: Secondary | ICD-10-CM | POA: Diagnosis not present

## 2017-05-06 DIAGNOSIS — N401 Enlarged prostate with lower urinary tract symptoms: Secondary | ICD-10-CM | POA: Diagnosis not present

## 2017-05-06 DIAGNOSIS — F528 Other sexual dysfunction not due to a substance or known physiological condition: Secondary | ICD-10-CM | POA: Diagnosis not present

## 2017-05-06 DIAGNOSIS — K2100 Gastro-esophageal reflux disease with esophagitis, without bleeding: Secondary | ICD-10-CM

## 2017-05-06 DIAGNOSIS — Z0001 Encounter for general adult medical examination with abnormal findings: Secondary | ICD-10-CM | POA: Diagnosis not present

## 2017-05-06 DIAGNOSIS — E785 Hyperlipidemia, unspecified: Secondary | ICD-10-CM | POA: Diagnosis not present

## 2017-05-06 DIAGNOSIS — Z0000 Encounter for general adult medical examination without abnormal findings: Secondary | ICD-10-CM

## 2017-05-06 DIAGNOSIS — R351 Nocturia: Secondary | ICD-10-CM

## 2017-05-06 LAB — CBC WITH DIFFERENTIAL/PLATELET
BASOS ABS: 0 10*3/uL (ref 0.0–0.1)
Basophils Relative: 0.4 % (ref 0.0–3.0)
EOS ABS: 0.1 10*3/uL (ref 0.0–0.7)
EOS PCT: 0.8 % (ref 0.0–5.0)
HCT: 43.8 % (ref 39.0–52.0)
HEMOGLOBIN: 14.7 g/dL (ref 13.0–17.0)
LYMPHS ABS: 2.3 10*3/uL (ref 0.7–4.0)
Lymphocytes Relative: 26.2 % (ref 12.0–46.0)
MCHC: 33.6 g/dL (ref 30.0–36.0)
MCV: 94.7 fl (ref 78.0–100.0)
MONO ABS: 0.8 10*3/uL (ref 0.1–1.0)
Monocytes Relative: 9.2 % (ref 3.0–12.0)
NEUTROS PCT: 63.4 % (ref 43.0–77.0)
Neutro Abs: 5.5 10*3/uL (ref 1.4–7.7)
Platelets: 322 10*3/uL (ref 150.0–400.0)
RBC: 4.62 Mil/uL (ref 4.22–5.81)
RDW: 13 % (ref 11.5–15.5)
WBC: 8.7 10*3/uL (ref 4.0–10.5)

## 2017-05-06 LAB — POCT URINALYSIS DIPSTICK
BILIRUBIN UA: NEGATIVE
GLUCOSE UA: NEGATIVE
KETONES UA: NEGATIVE
LEUKOCYTES UA: NEGATIVE
Nitrite, UA: NEGATIVE
Protein, UA: NEGATIVE
RBC UA: NEGATIVE
Urobilinogen, UA: 0.2 E.U./dL
pH, UA: 5 (ref 5.0–8.0)

## 2017-05-06 LAB — TSH: TSH: 2.75 u[IU]/mL (ref 0.35–4.50)

## 2017-05-06 LAB — LIPID PANEL
CHOLESTEROL: 175 mg/dL (ref 0–200)
HDL: 42.2 mg/dL (ref >39.00)
LDL CALC: 105 mg/dL — AB (ref 0–99)
NonHDL: 132.48
TRIGLYCERIDES: 138 mg/dL (ref 0.0–149.0)
Total CHOL/HDL Ratio: 4
VLDL: 27.6 mg/dL (ref 0.0–40.0)

## 2017-05-06 LAB — HEPATIC FUNCTION PANEL
ALBUMIN: 4.1 g/dL (ref 3.5–5.2)
ALT: 23 U/L (ref 0–53)
AST: 19 U/L (ref 0–37)
Alkaline Phosphatase: 74 U/L (ref 39–117)
BILIRUBIN TOTAL: 0.9 mg/dL (ref 0.2–1.2)
Bilirubin, Direct: 0.2 mg/dL (ref 0.0–0.3)
TOTAL PROTEIN: 6.8 g/dL (ref 6.0–8.3)

## 2017-05-06 LAB — PSA: PSA: 1.11 ng/mL (ref 0.10–4.00)

## 2017-05-06 LAB — BASIC METABOLIC PANEL
BUN: 15 mg/dL (ref 6–23)
CALCIUM: 9.1 mg/dL (ref 8.4–10.5)
CO2: 30 meq/L (ref 19–32)
CREATININE: 1.17 mg/dL (ref 0.40–1.50)
Chloride: 104 mEq/L (ref 96–112)
GFR: 65.33 mL/min (ref >60.00)
GLUCOSE: 98 mg/dL (ref 70–99)
Potassium: 4.5 mEq/L (ref 3.5–5.1)
Sodium: 140 mEq/L (ref 135–145)

## 2017-05-06 MED ORDER — HYDROCHLOROTHIAZIDE 25 MG PO TABS: 25.0000 mg | ORAL_TABLET | Freq: Every day | ORAL | 4 refills | Status: AC

## 2017-05-06 MED ORDER — OMEPRAZOLE 20 MG PO CPDR: 20.0000 mg | DELAYED_RELEASE_CAPSULE | Freq: Every day | ORAL | 4 refills | Status: AC

## 2017-05-06 MED ORDER — SILDENAFIL CITRATE 20 MG PO TABS: ORAL_TABLET | ORAL | 11 refills | Status: AC

## 2017-05-06 MED ORDER — SIMVASTATIN 20 MG PO TABS: ORAL_TABLET | ORAL | 3 refills | Status: DC

## 2017-05-06 MED ORDER — TAMSULOSIN HCL 0.4 MG PO CAPS: 0.4000 mg | ORAL_CAPSULE | Freq: Every day | ORAL | 4 refills | Status: DC

## 2017-05-06 NOTE — Progress Notes (Signed)
Michael Compton is a 71 year old married male nonsmoker who comes in today for a new physical examination because of a history of hyperlipidemia venous insufficiency reflux esophagitis erectile dysfunction and a new problem of nocturia 5  He takes had a core thiazide 25 mg daily because a history of venous insufficiency  He takes Prilosec 20 mg daily for chronic reflux esophagitis  He uses generic Viagra for ED  He takes Zocor 20 mg daily because of a history of hyperlipidemia. He had to cut the dose in half recently because of leg cramps. On 10 mg a day still having muscle and leg cramps at night especially.  A new prominence BPH with nocturia 5.  Vaccinations up-to-date seasonal flu shot given today information given on shingles  14 point review of systems reviewed and otherwise negative except for above  Cognitive function normal he walks daily home health safety reviewed no issues identified, no guns in the house, he does have a healthcare power of attorney and living well.  He does not take aspirin because of a history of GI intolerance.  EKG done because of a history of hyperlipidemia. EKG was normal and unchanged  BP 124/84 (BP Location: Left Arm, Patient Position: Sitting, Cuff Size: Normal)   Pulse (!) 57   Temp 98.7 F (37.1 C) (Oral)   Ht 5\' 9"  (1.753 m)   Wt 227 lb (103 kg)   BMI 33.52 kg/m  Well-developed well-nourished male no acute distress vital signs stable he is afebrile examination HEENT is pertinent his bilateral cataracts. He started you have difficulty with night vision. Referred to Dr. Ardyth Harps ENT examining was normal. No carotid bruits. Cardiopulmonary exam normal abdominal exam normal genitalia normal circumcised male rectal normal stool guaiac-negative prostate smooth nonnodular 1+ BPH. Extremities normal skin normal peripheral pulses normal  #1 hyperlipidemia,,,,,,, nighttime leg cramps,, on 10 mg daily,,,,,,,,, stop the Zocor completely  #2 venous  insufficiency.......... continue adequate thiazide  #3 reflux esophagitis........... continue Prilosec  #4 mild ED........... continue generic Viagra  #5 BPH..............[name] begin Flomax 0.4 daily at bedtime

## 2017-05-06 NOTE — Patient Instructions (Signed)
Stop the Zocor completely for 2 months............. then restarted by taking 10 mg Monday Wednesday Friday.............. if that dose causes your leg cramps to return........ stop it completely  Labs today,,,,,,,,,, I will call you if there is anything abnormal  Flomax 0.4.......... one tablet daily at bedtime......... it may take 2-3 months to see maximum affect  Return in one year for general physical exam sooner if any problems

## 2017-06-12 ENCOUNTER — Encounter: Payer: Self-pay | Admitting: Family Medicine

## 2017-06-12 ENCOUNTER — Ambulatory Visit (INDEPENDENT_AMBULATORY_CARE_PROVIDER_SITE_OTHER): Payer: PPO | Admitting: Family Medicine

## 2017-06-12 VITALS — BP 128/92 | HR 58 | Temp 99.2°F | Wt 231.0 lb

## 2017-06-12 DIAGNOSIS — B9789 Other viral agents as the cause of diseases classified elsewhere: Secondary | ICD-10-CM | POA: Diagnosis not present

## 2017-06-12 DIAGNOSIS — J069 Acute upper respiratory infection, unspecified: Secondary | ICD-10-CM

## 2017-06-12 MED ORDER — HYDROCODONE-HOMATROPINE 5-1.5 MG/5ML PO SYRP: 5.0000 mL | ORAL_SOLUTION | Freq: Three times a day (TID) | ORAL | 0 refills | Status: DC | PRN

## 2017-06-12 NOTE — Progress Notes (Signed)
Michael Compton is a 72 year old married male nonsmoker who comes in today for a five-day history of sore throat head congestion runny nose and cough. He says his cough is so hard his chest hurts. He said no fever chills or sputum production. He's never had a history of lung trouble in the past when he gets a bad cold.  Review of systems negative  BP (!) 128/92 (BP Location: Left Arm, Patient Position: Sitting, Cuff Size: Normal)   Pulse (!) 58   Temp 99.2 F (37.3 C) (Oral)   Wt 231 lb (104.8 kg)   SpO2 97%   BMI 34.11 kg/m  Well-developed well-nourished male no acute distress vital signs stable he is afebrile HEENT were negative neck was supple no adenopathy lungs are clear.  #1 viral syndrome............. treat symptomatically.

## 2017-06-12 NOTE — Patient Instructions (Signed)
Drink lots of water  Bedtime nasal irrigation as outlined with saline.........[name] Afrin..... Remember the Afrin has a 5 night limit...Marland KitchenMarland KitchenMarland Kitchen then steroid nasal spray  Hydromet........[name] 1/2-1 teaspoon 3 times daily. For cough  Return when necessary

## 2017-06-16 NOTE — Progress Notes (Addendum)
Subjective:   [place] is a 72 y.o. male who presents for Medicare Annual/Subsequent preventive examination.  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 Hepatitis C Screening  03-May-1946   [place] probably checked and was wavied per MD  Tobacco; Current every day smoker - no further information Cardiac Risk Factors include: advanced age (>36men, >27 women);dyslipidemia;hypertension;smoking/ tobacco exposure  PSA 03/2016 Colonoscopy 10/2014 - found a polyp  In 5 years he will have another   Shingrix discussed with the [place] Had the 1st vaccine in Nov 2nd in Feb  Goes to [facility] [place] [facility]  Meds; Simvastatin from the [facility] [place] and felt he had a bad reaction Legs started cramping Stop x 4 months; then restart at 1/2 dose      Objective:    Vitals: BP 120/80   Pulse 90   Ht 5\' 9"  (1.753 m)   Wt 228 lb (103.4 kg)   SpO2 93%   BMI 33.67 kg/m   Body mass index is 33.67 kg/m.  Advanced Directives 06/18/2017 10/07/2014  Does Patient Have a Medical Advance Directive? Yes Yes  Type of Advance Directive - Sims;Living will    Tobacco Social History   Tobacco Use  Smoking Status Current Some Day Smoker  . Types: Cigars  Smokeless Tobacco Never Used  Tobacco Comment   smokes a cigar once a week      Ready to quit: Not Answered Counseling given: Not Answered Comment: smokes a cigar once a week    Clinical Intake:      Past Medical History:  Diagnosis Date  . [place] DISEASE, CERVICAL 11/15/2008    . ERECTILE DYSFUNCTION 11/14/2007  . HYPERLIPIDEMIA 11/14/2007   Past Surgical History:  Procedure Laterality Date  . BELPHAROPTOSIS REPAIR    . FINGER SURGERY    . HERNIA REPAIR    . KNEE ARTHROSCOPY     Family History  Problem Relation Age of Onset  . Cancer Father   . Colon cancer Neg Hx    Social History   Socioeconomic History  . Marital status: Married    Spouse name: Not on file  . Number of children: Not on file  . Years of education: Not on file  . Highest education level: Not on file  Social Needs  . Financial resource strain: Not on file  . Food insecurity - worry: Not on file  . Food insecurity - inability: Not on file  . Transportation needs - medical: Not on file  . Transportation needs - non-medical: Not on file  Occupational History  . Not on file  Tobacco Use  . Smoking status: Current Some Day Smoker    Types: Cigars  . Smokeless tobacco: Never Used  . Tobacco comment: smokes a cigar once a week   Substance and Sexual Activity  . Alcohol use: Yes    Alcohol/week: 4.2 oz  Types: 4 Glasses of wine, 3 Cans of beer per week    Comment: moderate; every other day; 2 to 3  a day  . Drug use: No  . Sexual activity: Not on file  Other Topics Concern  . Not on file  Social History Narrative  . Not on file    Outpatient Encounter Medications as of 06/18/2017  Medication Sig  . acetaminophen (TYLENOL) 325 MG tablet Take 325 mg by mouth every 6 (six) hours as needed.  [name] glucosamine-chondroitin 500-400 MG tablet Take 2 tablets by mouth daily.   . hydrochlorothiazide (HYDRODIURIL) 25 MG tablet Take 1 tablet (25 mg total) by mouth daily.  [name] HYDROcodone-homatropine (HYCODAN) 5-1.5 MG/5ML syrup Take 5 mLs by mouth every 8 (eight) hours as needed.  [name] ibuprofen (ADVIL,MOTRIN) 200 MG tablet Take 200 mg by mouth every 6 (six) hours as needed.    [place] Oil 300 MG CAPS Take by mouth.  . Lysine 1000 MG TABS Take 1,000 mg by mouth daily.  . NON FORMULARY Memory  support - brainstrong  . Nutritional Supplements (CHOLESTEROL DEFENSE PO) Take 1 tablet by mouth daily.  [name] omeprazole (PRILOSEC) 20 MG capsule Take 1 capsule (20 mg total) by mouth daily.  . sildenafil (REVATIO) 20 MG tablet 1-2 tablets 2-3 hours prior to sex  . simvastatin (ZOCOR) 20 MG tablet One half tab daily at bedtime Monday Wednesday Friday  . tamsulosin (FLOMAX) 0.4 MG CAPS capsule Take 1 capsule (0.4 mg total) by mouth daily.   No facility-administered encounter medications on file as of 06/18/2017.     Activities of Daily Living In your present state of health, do you have any difficulty performing the following activities: 06/18/2017  Hearing? N  Vision? N  Difficulty concentrating or making decisions? N  Walking or climbing stairs? N  Dressing or bathing? N  Doing errands, shopping? N  Preparing Food and eating ? N  Using the Toilet? N  In the past six months, have you accidently leaked urine? N  Do you have problems with loss of bowel control? N  Managing your Medications? N  Managing your Finances? N  Housekeeping or managing your Housekeeping? N  Some recent data might be hidden    Patient Care Team: Dorena Cookey, MD as PCP - General   Assessment:   This is a routine wellness examination for [place].  Exercise Activities and Dietary recommendations Current Exercise Habits: Home exercise routine, Type of exercise: walking;Other - see comments(golfs x 1; ), Time (Minutes): 60, Frequency (Times/Week): 3, Weekly Exercise (Minutes/Week): 180, Intensity: Moderate  Goals    . Weight (lb) < 200 lb (90.7 kg)     Eat less and control portions Exercise more !       Fall Risk Fall Risk  06/18/2017 04/27/2016 02/08/2015 01/05/2014  Falls in the past year? No No No No  Comment - Emmi Telephone Survey: data to providers prior to load - -     Depression Screen PHQ 2/9 Scores 06/18/2017 02/08/2015 01/05/2014  PHQ - 2 Score 0 0 0    Cognitive Function Ad8 score reviewed  for issues:  Issues making decisions:  Less interest in hobbies / activities:  Repeats questions, stories (family complaining):  Trouble using ordinary gadgets (microwave, computer, phone):  Forgets the month or year:   Mismanaging finances:   Remembering appts:  Daily problems with thinking and/or memory: Ad8 score is=0     MMSE - Mini Mental State Exam 06/18/2017  Not  completed: (No Data)        Immunization History  Administered Date(s) Administered  . Influenza, High Dose Seasonal PF 02/08/2015, 04/16/2016, 05/06/2017  . Pneumococcal Conjugate-13 01/05/2014  . Pneumococcal Polysaccharide-23 11/14/2011  . Td 05/22/2003  . Tdap 01/05/2014  . Zoster 12/25/2010  . Zoster Recombinat (Shingrix) 03/21/2017    Screening Tests Health Maintenance  Topic Date Due  . Hepatitis C Screening  [birth year]  . COLONOSCOPY  10/27/2019  . TETANUS/TDAP  01/06/2024  . INFLUENZA VACCINE  Completed  . PNA vac Low Risk Adult  Completed        Plan:      PCP Notes   Health Maintenance Wiaved Hep c  In process of taking the shingrix at the [facility] [place]  Dr. Sherren Mocha checks his skin every year  Abnormal Screens  Occasional cigar when playing golf Describes himself as a Moderate drinker   (reviewed 2 per drinks per day but is not a daily drinker)   Referrals  none  Patient concerns; None, still working; plays golf x 1 per week and walks every day  Nurse Concerns; As noted  Next PCP apt Last PCP apt was 05/06/2017 Will schedule annually    I have personally reviewed and noted the following in the patient's chart:   . Medical and social history . Use of alcohol, tobacco or illicit drugs  . Current medications and supplements . Functional ability and status . Nutritional status . Physical activity . Advanced directives . List of other physicians . Hospitalizations, surgeries, and ER visits in previous 12 months . Vitals . Screenings to include cognitive, depression,  and falls . Referrals and appointments  In addition, I have reviewed and discussed with patient certain preventive protocols, quality metrics, and best practice recommendations. A written personalized care plan for preventive services as well as general preventive health recommendations were provided to patient.     Wynetta Fines, RN  06/18/2017  Macon Large.D.

## 2017-06-18 ENCOUNTER — Ambulatory Visit (INDEPENDENT_AMBULATORY_CARE_PROVIDER_SITE_OTHER): Payer: PPO

## 2017-06-18 VITALS — BP 120/80 | HR 90 | Ht 69.0 in | Wt 228.0 lb

## 2017-06-18 DIAGNOSIS — Z0000 Encounter for general adult medical examination without abnormal findings: Secondary | ICD-10-CM

## 2017-06-18 NOTE — Patient Instructions (Addendum)
[name] , Thank you for taking time to come for your Medicare Wellness Visit. I appreciate your ongoing commitment to your health goals. Please review the following plan we discussed and let me know if I can assist you in the future.     These are the goals we discussed: Goals    . Weight (lb) < 200 lb (90.7 kg)     Eat less and control portions Exercise more !       This is a list of the screening recommended for you and due dates:  Health Maintenance  Topic Date Due  .  Hepatitis C: One time screening is recommended by Center for Disease Control  (CDC) for  adults born from [birth year] through 1964.   Nov 11, 1945  . Colon Cancer Screening  10/27/2019  . Tetanus Vaccine  01/06/2024  . Flu Shot  Completed  . Pneumonia vaccines  Completed   Prevention of falls: Remove rugs or any tripping hazards in the home Use Non slip mats in bathtubs and showers Placing grab bars next to the toilet and or shower Placing handrails on both sides of the stair way Adding extra lighting in the home.   Personal safety issues reviewed:  1. Consider starting a community watch program per [facility] 2.  Changes batteries is smoke detector and/or carbon monoxide detector  3.  If you have firearms; keep them in a safe place 4.  Wear protection when in the sun; Always wear sunscreen or a hat; It is good to have your doctor check your skin annually or review any new areas of concern 5. Driving safety; Keep in the right lane; stay 3 car lengths behind the car in front of you on the highway; look 3 times prior to pulling out; carry your cell phone everywhere you go!         Fall Prevention in the Home Falls can cause injuries. They can happen to people of all ages. There are many things you can do to make your home safe and to help prevent falls. What can I do on the outside of my home?  Regularly fix the edges of walkways and driveways and fix any cracks.  Remove anything that might make  you trip as you walk through a door, such as a raised step or threshold.  Trim any bushes or trees on the path to your home.  Use bright outdoor lighting.  Clear any walking paths of anything that might make someone trip, such as rocks or tools.  Regularly check to see if handrails are loose or broken. Make sure that both sides of any steps have handrails.  Any raised decks and porches should have guardrails on the edges.  Have any leaves, snow, or ice cleared regularly.  Use sand or salt on walking paths during winter.  Clean up any spills in your garage right away. This includes oil or grease spills. What can I do in the bathroom?  Use night lights.  Install grab bars by the toilet and in the tub and shower. Do not use towel bars as grab bars.  Use non-skid mats or decals in the tub or shower.  If you need to sit down in the shower, use a plastic, non-slip stool.  Keep the floor dry. Clean up any water that spills on the floor as soon as it happens.  Remove soap buildup in the tub or shower regularly.  Attach bath mats securely with double-sided non-slip rug tape.  Do not have throw rugs and other things on the floor that can make you trip. What can I do in the bedroom?  Use night lights.  Make sure that you have a light by your bed that is easy to reach.  Do not use any sheets or blankets that are too big for your bed. They should not hang down onto the floor.  Have a firm chair that has side arms. You can use this for support while you get dressed.  Do not have throw rugs and other things on the floor that can make you trip. What can I do in the kitchen?  Clean up any spills right away.  Avoid walking on wet floors.  Keep items that you use a lot in easy-to-reach places.  If you need to reach something above you, use a strong step stool that has a grab bar.  Keep electrical cords out of the way.  Do not use floor polish or wax that makes floors slippery.  If you must use wax, use non-skid floor wax.  Do not have throw rugs and other things on the floor that can make you trip. What can I do with my stairs?  Do not leave any items on the stairs.  Make sure that there are handrails on both sides of the stairs and use them. Fix handrails that are broken or loose. Make sure that handrails are as long as the stairways.  Check any carpeting to make sure that it is firmly attached to the stairs. Fix any carpet that is loose or worn.  Avoid having throw rugs at the top or bottom of the stairs. If you do have throw rugs, attach them to the floor with carpet tape.  Make sure that you have a light switch at the top of the stairs and the bottom of the stairs. If you do not have them, ask someone to add them for you. What else can I do to help prevent falls?  Wear shoes that: ? Do not have high heels. ? Have rubber bottoms. ? Are comfortable and fit you well. ? Are closed at the toe. Do not wear sandals.  If you use a stepladder: ? Make sure that it is fully opened. Do not climb a closed stepladder. ? Make sure that both sides of the stepladder are locked into place. ? Ask someone to hold it for you, if possible.  Clearly mark and make sure that you can see: ? Any grab bars or handrails. ? First and last steps. ? Where the edge of each step is.  Use tools that help you move around (mobility aids) if they are needed. These include: ? Canes. ? Walkers. ? Scooters. ? Crutches.  Turn on the lights when you go into a dark area. Replace any light bulbs as soon as they burn out.  Set up your furniture so you have a clear path. Avoid moving your furniture around.  If any of your floors are uneven, fix them.  If there are any pets around you, be aware of where they are.  Review your medicines with your doctor. Some medicines can make you feel dizzy. This can increase your chance of falling. Ask your doctor what other things that you can do to  help prevent falls. This information is not intended to replace advice given to you by your health care provider. Make sure you discuss any questions you have with your health care provider. Document Released: 03/03/2009 Document Revised:  10/13/2015 Document Reviewed: 06/11/2014 Elsevier Interactive Patient Education  2018 [place] Maintenance, Male A healthy lifestyle and preventive care is important for your health and wellness. Ask your health care provider about what schedule of regular examinations is right for you. What should I know about weight and diet? Eat a Healthy Diet  Eat plenty of vegetables, fruits, whole grains, low-fat dairy products, and lean protein.  Do not eat a lot of foods high in solid fats, added sugars, or salt.  Maintain a Healthy Weight Regular exercise can help you achieve or maintain a healthy weight. You should:  Do at least 150 minutes of exercise each week. The exercise should increase your heart rate and make you sweat (moderate-intensity exercise).  Do strength-training exercises at least twice a week.  Watch Your Levels of Cholesterol and Blood Lipids  Have your blood tested for lipids and cholesterol every 5 years starting at 71 years of age. If you are at high risk for heart disease, you should start having your blood tested when you are 71 years old. You may need to have your cholesterol levels checked more often if: ? Your lipid or cholesterol levels are high. ? You are older than 71 years of age. ? You are at high risk for heart disease.  What should I know about cancer screening? Many types of cancers can be detected early and may often be prevented. Lung Cancer  You should be screened every year for lung cancer if: ? You are a current smoker who has smoked for at least 30 years. ? You are a former smoker who has quit within the past 15 years.  Talk to your health care provider about your screening options, when you should  start screening, and how often you should be screened.  Colorectal Cancer  Routine colorectal cancer screening usually begins at 71 years of age and should be repeated every 5-10 years until you are 71 years old. You may need to be screened more often if early forms of precancerous polyps or small growths are found. Your health care provider may recommend screening at an earlier age if you have risk factors for colon cancer.  Your health care provider may recommend using home test kits to check for hidden blood in the stool.  A small camera at the end of a tube can be used to examine your colon (sigmoidoscopy or colonoscopy). This checks for the earliest forms of colorectal cancer.  Prostate and Testicular Cancer  Depending on your age and overall health, your health care provider may do certain tests to screen for prostate and testicular cancer.  Talk to your health care provider about any symptoms or concerns you have about testicular or prostate cancer.  Skin Cancer  Check your skin from head to toe regularly.  Tell your health care provider about any new moles or changes in moles, especially if: ? There is a change in a mole's size, shape, or color. ? You have a mole that is larger than a pencil eraser.  Always use sunscreen. Apply sunscreen liberally and repeat throughout the day.  Protect yourself by wearing long sleeves, pants, a wide-brimmed hat, and sunglasses when outside.  What should I know about heart disease, diabetes, and high blood pressure?  If you are 2-67 years of age, have your blood pressure checked every 3-5 years. If you are 56 years of age or older, have your blood pressure checked every year. You should have your blood  pressure measured twice-once when you are at a hospital or clinic, and once when you are not at a hospital or clinic. Record the average of the two measurements. To check your blood pressure when you are not at a hospital or clinic, you can  use: ? An automated blood pressure machine at a pharmacy. ? A home blood pressure monitor.  Talk to your health care provider about your target blood pressure.  If you are between 45-50 years old, ask your health care provider if you should take aspirin to prevent heart disease.  Have regular diabetes screenings by checking your fasting blood sugar level. ? If you are at a normal weight and have a low risk for diabetes, have this test once every three years after the age of 87. ? If you are overweight and have a high risk for diabetes, consider being tested at a younger age or more often.  A one-time screening for abdominal aortic aneurysm (AAA) by ultrasound is recommended for men aged 24-75 years who are current or former smokers. What should I know about preventing infection? Hepatitis B If you have a higher risk for hepatitis B, you should be screened for this virus. Talk with your health care provider to find out if you are at risk for hepatitis B infection. Hepatitis C Blood testing is recommended for:  Everyone born from [birth year] through 1964.  Anyone with known risk factors for hepatitis C.  Sexually Transmitted Diseases (STDs)  You should be screened each year for STDs including gonorrhea and chlamydia if: ? You are sexually active and are younger than 71 years of age. ? You are older than 71 years of age and your health care provider tells you that you are at risk for this type of infection. ? Your sexual activity has changed since you were last screened and you are at an increased risk for chlamydia or gonorrhea. Ask your health care provider if you are at risk.  Talk with your health care provider about whether you are at high risk of being infected with HIV. Your health care provider may recommend a prescription medicine to help prevent HIV infection.  What else can I do?  Schedule regular health, dental, and eye exams.  Stay current with your vaccines  (immunizations).  Do not use any tobacco products, such as cigarettes, chewing tobacco, and e-cigarettes. If you need help quitting, ask your health care provider.  Limit alcohol intake to no more than 2 drinks per day. One drink equals 12 ounces of beer, 5 ounces of wine, or 1 ounces of hard liquor.  Do not use street drugs.  Do not share needles.  Ask your health care provider for help if you need support or information about quitting drugs.  Tell your health care provider if you often feel depressed.  Tell your health care provider if you have ever been abused or do not feel safe at home. This information is not intended to replace advice given to you by your health care provider. Make sure you discuss any questions you have with your health care provider. Document Released: 11/03/2007 Document Revised: 01/04/2016 Document Reviewed: 02/08/2015 Elsevier Interactive Patient Education  [employer].

## 2018-04-29 ENCOUNTER — Encounter (HOSPITAL_COMMUNITY)
Admission: EM | Disposition: A | Discharge status: Home / Self Care | Payer: Self-pay | Source: Home / Self Care | Attending: Emergency Medicine

## 2018-04-29 ENCOUNTER — Encounter (HOSPITAL_COMMUNITY): Payer: Self-pay | Admitting: Emergency Medicine

## 2018-04-29 ENCOUNTER — Observation Stay (HOSPITAL_COMMUNITY): Payer: PPO | Admitting: Certified Registered Nurse Anesthetist

## 2018-04-29 ENCOUNTER — Ambulatory Visit: Admit: 2018-04-29 | Payer: PPO | Admitting: Urology

## 2018-04-29 ENCOUNTER — Other Ambulatory Visit: Payer: Self-pay

## 2018-04-29 ENCOUNTER — Observation Stay (HOSPITAL_COMMUNITY): Payer: PPO

## 2018-04-29 ENCOUNTER — Observation Stay (HOSPITAL_COMMUNITY)
Admission: EM | Admit: 2018-04-29 | Discharge: 2018-04-29 | Disposition: A | Discharge status: Home / Self Care | Payer: PPO | Attending: Urology | Admitting: Urology

## 2018-04-29 ENCOUNTER — Emergency Department (HOSPITAL_COMMUNITY): Payer: PPO

## 2018-04-29 DIAGNOSIS — Z79899 Other long term (current) drug therapy: Secondary | ICD-10-CM | POA: Diagnosis not present

## 2018-04-29 DIAGNOSIS — I10 Essential (primary) hypertension: Secondary | ICD-10-CM | POA: Diagnosis not present

## 2018-04-29 DIAGNOSIS — N200 Calculus of kidney: Secondary | ICD-10-CM

## 2018-04-29 DIAGNOSIS — F17290 Nicotine dependence, other tobacco product, uncomplicated: Secondary | ICD-10-CM | POA: Insufficient documentation

## 2018-04-29 DIAGNOSIS — N132 Hydronephrosis with renal and ureteral calculous obstruction: Principal | ICD-10-CM

## 2018-04-29 DIAGNOSIS — N201 Calculus of ureter: Secondary | ICD-10-CM

## 2018-04-29 DIAGNOSIS — N12 Tubulo-interstitial nephritis, not specified as acute or chronic: Secondary | ICD-10-CM | POA: Diagnosis not present

## 2018-04-29 DIAGNOSIS — E785 Hyperlipidemia, unspecified: Secondary | ICD-10-CM | POA: Insufficient documentation

## 2018-04-29 DIAGNOSIS — N401 Enlarged prostate with lower urinary tract symptoms: Secondary | ICD-10-CM | POA: Diagnosis not present

## 2018-04-29 DIAGNOSIS — R001 Bradycardia, unspecified: Secondary | ICD-10-CM

## 2018-04-29 DIAGNOSIS — K219 Gastro-esophageal reflux disease without esophagitis: Secondary | ICD-10-CM | POA: Diagnosis not present

## 2018-04-29 DIAGNOSIS — R351 Nocturia: Secondary | ICD-10-CM | POA: Diagnosis not present

## 2018-04-29 DIAGNOSIS — R109 Unspecified abdominal pain: Secondary | ICD-10-CM | POA: Diagnosis not present

## 2018-04-29 HISTORY — PX: CYSTOSCOPY/URETEROSCOPY/HOLMIUM LASER/STENT PLACEMENT: SHX6546

## 2018-04-29 HISTORY — DX: Essential (primary) hypertension: I10

## 2018-04-29 LAB — CBC WITH DIFFERENTIAL/PLATELET
Abs Immature Granulocytes: 0.04 10*3/uL (ref 0.00–0.07)
Basophils Absolute: 0 10*3/uL (ref 0.0–0.1)
Basophils Relative: 0 %
Eosinophils Absolute: 0 10*3/uL (ref 0.0–0.5)
Eosinophils Relative: 0 %
HCT: 42.9 % (ref 39.0–52.0)
Hemoglobin: 13.8 g/dL (ref 13.0–17.0)
Immature Granulocytes: 0 %
Lymphocytes Relative: 13 %
Lymphs Abs: 1.5 10*3/uL (ref 0.7–4.0)
MCH: 30.8 pg (ref 26.0–34.0)
MCHC: 32.2 g/dL (ref 30.0–36.0)
MCV: 95.8 fL (ref 80.0–100.0)
MONOS PCT: 6 %
Monocytes Absolute: 0.7 10*3/uL (ref 0.1–1.0)
Neutro Abs: 9.5 10*3/uL — ABNORMAL HIGH (ref 1.7–7.7)
Neutrophils Relative %: 81 %
Platelets: 285 10*3/uL (ref 150–400)
RBC: 4.48 MIL/uL (ref 4.22–5.81)
RDW: 12.2 % (ref 11.5–15.5)
WBC: 11.7 10*3/uL — ABNORMAL HIGH (ref 4.0–10.5)
nRBC: 0 % (ref 0.0–0.2)

## 2018-04-29 LAB — URINALYSIS, ROUTINE W REFLEX MICROSCOPIC
BILIRUBIN URINE: NEGATIVE
Glucose, UA: NEGATIVE mg/dL
Ketones, ur: NEGATIVE mg/dL
LEUKOCYTES UA: NEGATIVE
Nitrite: NEGATIVE
Protein, ur: NEGATIVE mg/dL
RBC / HPF: >50 RBC/hpf — ABNORMAL HIGH (ref 0–5)
SPECIFIC GRAVITY, URINE: 1.02 (ref 1.005–1.030)
pH: 5 (ref 5.0–8.0)

## 2018-04-29 LAB — I-STAT TROPONIN, ED: Troponin i, poc: 0 ng/mL (ref 0.00–0.08)

## 2018-04-29 LAB — SURGICAL PCR SCREEN
MRSA, PCR: NEGATIVE
Staphylococcus aureus: NEGATIVE

## 2018-04-29 LAB — COMPREHENSIVE METABOLIC PANEL
ALT: 21 U/L (ref 0–44)
AST: 26 U/L (ref 15–41)
Albumin: 3.8 g/dL (ref 3.5–5.0)
Alkaline Phosphatase: 59 U/L (ref 38–126)
Anion gap: 11 (ref 5–15)
BUN: 18 mg/dL (ref 8–23)
CHLORIDE: 105 mmol/L (ref 98–111)
CO2: 26 mmol/L (ref 22–32)
CREATININE: 1.44 mg/dL — AB (ref 0.61–1.24)
Calcium: 8.9 mg/dL (ref 8.9–10.3)
GFR calc Af Amer: 56 mL/min — ABNORMAL LOW (ref >60)
GFR, EST NON AFRICAN AMERICAN: 49 mL/min — AB (ref >60)
Glucose, Bld: 155 mg/dL — ABNORMAL HIGH (ref 70–99)
Potassium: 3.7 mmol/L (ref 3.5–5.1)
Sodium: 142 mmol/L (ref 135–145)
Total Bilirubin: 0.7 mg/dL (ref 0.3–1.2)
Total Protein: 7 g/dL (ref 6.5–8.1)

## 2018-04-29 SURGERY — CYSTOSCOPY/URETEROSCOPY/HOLMIUM LASER/STENT PLACEMENT
Anesthesia: General | Laterality: Left

## 2018-04-29 MED ORDER — IOHEXOL 300 MG/ML  SOLN
INTRAMUSCULAR | Status: DC | PRN
  Administered 2018-04-29: 50 mL via INTRAVENOUS

## 2018-04-29 MED ORDER — PROMETHAZINE HCL 25 MG/ML IJ SOLN
12.5000 mg | Freq: Once | INTRAMUSCULAR | Status: AC
  Administered 2018-04-29: 12.5 mg via INTRAVENOUS
  Filled 2018-04-29: qty 1

## 2018-04-29 MED ORDER — CEPHALEXIN 500 MG PO CAPS: 500.0000 mg | ORAL_CAPSULE | Freq: Two times a day (BID) | ORAL | 0 refills | Status: AC

## 2018-04-29 MED ORDER — OXYCODONE-ACETAMINOPHEN 5-325 MG PO TABS
1.0000 | ORAL_TABLET | Freq: Once | ORAL | Status: DC
  Filled 2018-04-29: qty 1

## 2018-04-29 MED ORDER — DEXAMETHASONE SODIUM PHOSPHATE 10 MG/ML IJ SOLN
INTRAMUSCULAR | Status: AC
  Filled 2018-04-29: qty 1

## 2018-04-29 MED ORDER — FENTANYL CITRATE (PF) 100 MCG/2ML IJ SOLN: 25.0000 ug | INTRAMUSCULAR | Status: DC | PRN

## 2018-04-29 MED ORDER — ONDANSETRON HCL 4 MG/2ML IJ SOLN
4.0000 mg | Freq: Once | INTRAMUSCULAR | Status: AC
  Administered 2018-04-29: 4 mg via INTRAVENOUS
  Filled 2018-04-29: qty 2

## 2018-04-29 MED ORDER — LIDOCAINE 2% (20 MG/ML) 5 ML SYRINGE
INTRAMUSCULAR | Status: DC | PRN
  Administered 2018-04-29: 100 mg via INTRAVENOUS

## 2018-04-29 MED ORDER — HYDROCHLOROTHIAZIDE 25 MG PO TABS: 25.0000 mg | ORAL_TABLET | Freq: Every day | ORAL | Status: DC

## 2018-04-29 MED ORDER — CEFAZOLIN SODIUM-DEXTROSE 2-4 GM/100ML-% IV SOLN
2.0000 g | INTRAVENOUS | Status: AC
  Administered 2018-04-29: 2 g via INTRAVENOUS
  Filled 2018-04-29 (×2): qty 100

## 2018-04-29 MED ORDER — MORPHINE SULFATE (PF) 4 MG/ML IV SOLN
2.0000 mg | INTRAVENOUS | Status: DC | PRN
  Administered 2018-04-29: 2 mg via INTRAVENOUS
  Filled 2018-04-29: qty 1

## 2018-04-29 MED ORDER — EPHEDRINE SULFATE-NACL 50-0.9 MG/10ML-% IV SOSY
PREFILLED_SYRINGE | INTRAVENOUS | Status: DC | PRN
  Administered 2018-04-29: 10 mg via INTRAVENOUS

## 2018-04-29 MED ORDER — CEPHALEXIN 500 MG PO CAPS: 500.0000 mg | ORAL_CAPSULE | Freq: Two times a day (BID) | ORAL | 0 refills | Status: DC

## 2018-04-29 MED ORDER — FENTANYL CITRATE (PF) 100 MCG/2ML IJ SOLN
INTRAMUSCULAR | Status: DC | PRN
  Administered 2018-04-29 (×4): 50 ug via INTRAVENOUS

## 2018-04-29 MED ORDER — SODIUM CHLORIDE 0.9 % IV BOLUS
500.0000 mL | Freq: Once | INTRAVENOUS | Status: AC
  Administered 2018-04-29: 500 mL via INTRAVENOUS

## 2018-04-29 MED ORDER — DEXAMETHASONE SODIUM PHOSPHATE 10 MG/ML IJ SOLN
INTRAMUSCULAR | Status: DC | PRN
  Administered 2018-04-29: 10 mg via INTRAVENOUS

## 2018-04-29 MED ORDER — OXYBUTYNIN CHLORIDE 5 MG PO TABS: 5.0000 mg | ORAL_TABLET | Freq: Three times a day (TID) | ORAL | Status: DC | PRN

## 2018-04-29 MED ORDER — DOCUSATE SODIUM 100 MG PO CAPS
100.0000 mg | ORAL_CAPSULE | Freq: Two times a day (BID) | ORAL | Status: DC
  Administered 2018-04-29: 100 mg via ORAL
  Filled 2018-04-29: qty 1

## 2018-04-29 MED ORDER — PROPOFOL 10 MG/ML IV BOLUS
INTRAVENOUS | Status: DC | PRN
  Administered 2018-04-29: 30 mg via INTRAVENOUS
  Administered 2018-04-29: 180 mg via INTRAVENOUS

## 2018-04-29 MED ORDER — FENTANYL CITRATE (PF) 100 MCG/2ML IJ SOLN
INTRAMUSCULAR | Status: AC
  Filled 2018-04-29: qty 2

## 2018-04-29 MED ORDER — ONDANSETRON HCL 4 MG/2ML IJ SOLN
INTRAMUSCULAR | Status: AC
  Filled 2018-04-29: qty 2

## 2018-04-29 MED ORDER — MUPIROCIN 2 % EX OINT: 1.0000 "application " | TOPICAL_OINTMENT | Freq: Two times a day (BID) | CUTANEOUS | Status: DC

## 2018-04-29 MED ORDER — DIPHENHYDRAMINE HCL 12.5 MG/5ML PO ELIX: 12.5000 mg | ORAL_SOLUTION | Freq: Four times a day (QID) | ORAL | Status: DC | PRN

## 2018-04-29 MED ORDER — PROMETHAZINE HCL 25 MG/ML IJ SOLN
6.2500 mg | Freq: Once | INTRAMUSCULAR | Status: AC
  Administered 2018-04-29: 6.25 mg via INTRAVENOUS
  Filled 2018-04-29: qty 1

## 2018-04-29 MED ORDER — MORPHINE SULFATE (PF) 4 MG/ML IV SOLN
4.0000 mg | Freq: Once | INTRAVENOUS | Status: AC
  Administered 2018-04-29: 4 mg via INTRAVENOUS
  Filled 2018-04-29: qty 1

## 2018-04-29 MED ORDER — PHENYLEPHRINE 40 MCG/ML (10ML) SYRINGE FOR IV PUSH (FOR BLOOD PRESSURE SUPPORT)
PREFILLED_SYRINGE | INTRAVENOUS | Status: DC | PRN
  Administered 2018-04-29 (×2): 80 ug via INTRAVENOUS
  Administered 2018-04-29: 120 ug via INTRAVENOUS
  Administered 2018-04-29: 80 ug via INTRAVENOUS

## 2018-04-29 MED ORDER — ROCURONIUM BROMIDE 50 MG/5ML IV SOSY
PREFILLED_SYRINGE | INTRAVENOUS | Status: DC | PRN
  Administered 2018-04-29: 40 mg via INTRAVENOUS

## 2018-04-29 MED ORDER — SENNA 8.6 MG PO TABS
1.0000 | ORAL_TABLET | Freq: Two times a day (BID) | ORAL | Status: DC
  Administered 2018-04-29: 8.6 mg via ORAL
  Filled 2018-04-29: qty 1

## 2018-04-29 MED ORDER — TAMSULOSIN HCL 0.4 MG PO CAPS
0.4000 mg | ORAL_CAPSULE | Freq: Every day | ORAL | Status: DC
  Filled 2018-04-29: qty 1

## 2018-04-29 MED ORDER — DIPHENHYDRAMINE HCL 50 MG/ML IJ SOLN: 12.5000 mg | Freq: Four times a day (QID) | INTRAMUSCULAR | Status: DC | PRN

## 2018-04-29 MED ORDER — HYDROMORPHONE HCL 1 MG/ML IJ SOLN
1.0000 mg | Freq: Once | INTRAMUSCULAR | Status: AC
  Administered 2018-04-29: 1 mg via INTRAVENOUS
  Filled 2018-04-29: qty 1

## 2018-04-29 MED ORDER — BELLADONNA ALKALOIDS-OPIUM 16.2-60 MG RE SUPP: 1.0000 | Freq: Four times a day (QID) | RECTAL | Status: DC | PRN

## 2018-04-29 MED ORDER — SUCCINYLCHOLINE CHLORIDE 200 MG/10ML IV SOSY
PREFILLED_SYRINGE | INTRAVENOUS | Status: DC | PRN
  Administered 2018-04-29: 140 mg via INTRAVENOUS

## 2018-04-29 MED ORDER — PROPOFOL 10 MG/ML IV BOLUS
INTRAVENOUS | Status: AC
  Filled 2018-04-29: qty 20

## 2018-04-29 MED ORDER — MIDAZOLAM HCL 5 MG/5ML IJ SOLN
INTRAMUSCULAR | Status: DC | PRN
  Administered 2018-04-29: 2 mg via INTRAVENOUS

## 2018-04-29 MED ORDER — OXYCODONE HCL 5 MG PO TABS
5.0000 mg | ORAL_TABLET | ORAL | Status: DC | PRN
  Administered 2018-04-29: 5 mg via ORAL
  Filled 2018-04-29: qty 1

## 2018-04-29 MED ORDER — HYDROMORPHONE HCL 1 MG/ML IJ SOLN
0.5000 mg | Freq: Once | INTRAMUSCULAR | Status: AC
  Administered 2018-04-29: 0.5 mg via INTRAVENOUS
  Filled 2018-04-29: qty 1

## 2018-04-29 MED ORDER — FENTANYL CITRATE (PF) 100 MCG/2ML IJ SOLN
100.0000 ug | Freq: Once | INTRAMUSCULAR | Status: AC
  Administered 2018-04-29: 100 ug via INTRAVENOUS
  Filled 2018-04-29: qty 2

## 2018-04-29 MED ORDER — LACTATED RINGERS IV SOLN
INTRAVENOUS | Status: DC
  Administered 2018-04-29: 18:00:00 via INTRAVENOUS

## 2018-04-29 MED ORDER — IOHEXOL 300 MG/ML  SOLN: 100.0000 mL | Freq: Once | INTRAMUSCULAR | Status: DC

## 2018-04-29 MED ORDER — MIDAZOLAM HCL 2 MG/2ML IJ SOLN
INTRAMUSCULAR | Status: AC
  Filled 2018-04-29: qty 2

## 2018-04-29 MED ORDER — ACETAMINOPHEN 325 MG PO TABS: 650.0000 mg | ORAL_TABLET | ORAL | Status: DC | PRN

## 2018-04-29 MED ORDER — ONDANSETRON HCL 4 MG/2ML IJ SOLN: 4.0000 mg | INTRAMUSCULAR | Status: DC | PRN

## 2018-04-29 MED ORDER — SUGAMMADEX SODIUM 200 MG/2ML IV SOLN
INTRAVENOUS | Status: DC | PRN
  Administered 2018-04-29: 200 mg via INTRAVENOUS

## 2018-04-29 MED ORDER — SODIUM CHLORIDE 0.9 % IV SOLN
INTRAVENOUS | Status: DC
  Administered 2018-04-29: 18:00:00 via INTRAVENOUS

## 2018-04-29 MED ORDER — OXYCODONE HCL 5 MG PO TABS: 5.0000 mg | ORAL_TABLET | ORAL | 0 refills | Status: AC | PRN

## 2018-04-29 MED ORDER — ONDANSETRON HCL 4 MG/2ML IJ SOLN
INTRAMUSCULAR | Status: DC | PRN
  Administered 2018-04-29: 4 mg via INTRAVENOUS

## 2018-04-29 SURGICAL SUPPLY — 22 items
BAG URO CATCHER STRL LF (MISCELLANEOUS) ×2 IMPLANT
BASKET LASER NITINOL 1.9FR (BASKET) IMPLANT
BASKET ZERO TIP NITINOL 2.4FR (BASKET) IMPLANT
CATH INTERMIT  6FR 70CM (CATHETERS) IMPLANT
CATH URET 5FR 28IN CONE TIP (BALLOONS)
CATH URET 5FR 70CM CONE TIP (BALLOONS) IMPLANT
CLOTH BEACON ORANGE TIMEOUT ST (SAFETY) ×2 IMPLANT
COVER WAND RF STERILE (DRAPES) IMPLANT
EXTRACTOR STONE 1.7FRX115CM (UROLOGICAL SUPPLIES) IMPLANT
FIBER LASER FLEXIVA 365 (UROLOGICAL SUPPLIES) IMPLANT
FIBER LASER TRAC TIP (UROLOGICAL SUPPLIES) ×2 IMPLANT
GLOVE BIO SURGEON STRL SZ7.5 (GLOVE) ×2 IMPLANT
GOWN STRL REUS W/TWL XL LVL3 (GOWN DISPOSABLE) ×2 IMPLANT
GUIDEWIRE ANG ZIPWIRE 038X150 (WIRE) IMPLANT
GUIDEWIRE STR DUAL SENSOR (WIRE) ×2 IMPLANT
INFUSOR MANOMETER BAG 3000ML (MISCELLANEOUS) ×2 IMPLANT
MANIFOLD NEPTUNE II (INSTRUMENTS) ×2 IMPLANT
PACK CYSTO (CUSTOM PROCEDURE TRAY) ×2 IMPLANT
SHEATH URETERAL 12FRX28CM (UROLOGICAL SUPPLIES) IMPLANT
SHEATH URETERAL 12FRX35CM (MISCELLANEOUS) IMPLANT
STENT URET 6FRX26 CONTOUR (STENTS) ×2 IMPLANT
TUBING UROLOGY SET (TUBING) ×2 IMPLANT

## 2018-04-29 NOTE — ED Provider Notes (Signed)
[facility] Provider Note   CSN: [id number] Arrival date & time: 04/29/18  0636     History   Chief Complaint Chief Complaint  Patient presents with  . Flank Pain    HPI Michael Compton is a 72 y.o. male for evaluation of left-sided flank pain.  Patient states he has been having some discomfort of his left flank for the past 3 days.  Acutely in the middle the night, patient had severe worsening of flank pain.  It is constant and severe.  He has associated nausea.  He has not taken anything for pain including Tylenol or ibuprofen.  He denies history of similar.  He denies history of kidney stones.  He takes HCTz and flomax, no other medical problems.  He denies history of bradycardia.  He denies fevers, chills, chest pain, shortness of breath, anterior abdominal pain, urinary symptoms, or abnormal bowel movements.  He denies dysuria, hematuria, urinary frequency. He is not on blood thinners.   HPI  Past Medical History:  Diagnosis Date  . [place] DISEASE, CERVICAL 11/15/2008  . ERECTILE DYSFUNCTION 11/14/2007  . HYPERLIPIDEMIA 11/14/2007  . Hypertension     Patient Active Problem List   Diagnosis Date Noted  . Viral URI with cough 06/12/2017  . BPH associated with nocturia 05/06/2017  . Actinic keratosis 02/15/2015  . Routine general medical examination at a health care facility 02/08/2015  . Inflamed seborrheic keratosis 09/08/2013  . Chronic reflux esophagitis 11/17/2012  . [place] DISEASE, CERVICAL 11/15/2008  . Hyperlipidemia 11/14/2007  . ERECTILE DYSFUNCTION 11/14/2007    Past Surgical History:  Procedure Laterality Date  . BELPHAROPTOSIS REPAIR    . FINGER SURGERY    . HERNIA REPAIR    . KNEE ARTHROSCOPY          Home Medications    Prior to Admission medications   Medication Sig Start Date End Date Taking? Authorizing Provider  acetaminophen (TYLENOL) 325 MG tablet Take 325 mg by mouth every 6 (six) hours as needed.     [provider]  glucosamine-chondroitin 500-400 MG tablet Take 2 tablets by mouth daily.     [provider]  hydrochlorothiazide (HYDRODIURIL) 25 MG tablet Take 1 tablet (25 mg total) by mouth daily. 05/06/17   Dorena Cookey, MD  HYDROcodone-homatropine [facility]) 5-1.5 MG/5ML syrup Take 5 mLs by mouth every 8 (eight) hours as needed. 06/12/17   Dorena Cookey, MD  ibuprofen (ADVIL,MOTRIN) 200 MG tablet Take 200 mg by mouth every 6 (six) hours as needed.      [provider]  Astrid Drafts 300 MG CAPS Take by mouth.    [provider]  Lysine 1000 MG TABS Take 1,000 mg by mouth daily.    [provider]  NON FORMULARY Memory support - brainstrong    [provider]  Nutritional Supplements (CHOLESTEROL DEFENSE PO) Take 1 tablet by mouth daily.    [provider]  omeprazole (PRILOSEC) 20 MG capsule Take 1 capsule (20 mg total) by mouth daily. 05/06/17   Dorena Cookey, MD  sildenafil (REVATIO) 20 MG tablet 1-2 tablets 2-3 hours prior to sex 05/06/17   Dorena Cookey, MD  simvastatin (ZOCOR) 20 MG tablet One half tab daily at bedtime Monday Wednesday Friday 05/06/17   Dorena Cookey, MD  tamsulosin (FLOMAX) 0.4 MG CAPS capsule Take 1 capsule (0.4 mg total) by mouth daily. 05/06/17   Dorena Cookey, MD    Family History  Family History  Problem Relation Age of Onset  . Cancer Father   . Colon cancer Neg Hx     Social History Social History   Tobacco Use  . Smoking status: Current Some Day Smoker    Types: Cigars  . Smokeless tobacco: Never Used  . Tobacco comment: smokes a cigar once a week   Substance Use Topics  . Alcohol use: Yes    Alcohol/week: 7.0 standard drinks    Types: 4 Glasses of wine, 3 Cans of beer per week    Comment: moderate; every other day; 2 to 3  a day  . Drug use: No     Allergies   Patient has no known allergies.   Review of Systems Review of Systems  Genitourinary: Positive for flank  pain.  All other systems reviewed and are negative.    Physical Exam Updated Vital Signs BP (!) 148/82   Pulse (!) 48   Temp 98.2 F (36.8 C)   Resp 18   SpO2 100%   Physical Exam  Constitutional: He is oriented to person, place, and time. He appears well-developed and well-nourished.  Elderly male who appears very uncomfortable due to pain. Pacing around room due to discomfort  HENT:  Head: Normocephalic and atraumatic.  Eyes: Pupils are equal, round, and reactive to light. Conjunctivae and EOM are normal.  Neck: Normal range of motion. Neck supple.  Cardiovascular: Regular rhythm and intact distal pulses.  Bradycardic around 45  Pulmonary/Chest: Effort normal and breath sounds normal. No respiratory distress. He has no wheezes.  Abdominal: Soft. He exhibits no distension and no mass. There is no tenderness. There is no rebound and no guarding.  No CVA tenderness.  Musculoskeletal: Normal range of motion.  Back pain is not reproducible with palpation.  Neurological: He is alert and oriented to person, place, and time.  Skin: Skin is warm and dry. Capillary refill takes less than 2 seconds.  Psychiatric: He has a normal mood and affect.  Nursing note and vitals reviewed.    ED Treatments / Results  Labs (all labs ordered are listed, but only abnormal results are displayed) Labs Reviewed  URINALYSIS, ROUTINE W REFLEX MICROSCOPIC - Abnormal; Notable for the following components:      Result Value   Hgb urine dipstick LARGE (*)    RBC / HPF >50 (*)    Bacteria, UA RARE (*)    All other components within normal limits  CBC WITH DIFFERENTIAL/PLATELET - Abnormal; Notable for the following components:   WBC 11.7 (*)    Neutro Abs 9.5 (*)    All other components within normal limits  COMPREHENSIVE METABOLIC PANEL - Abnormal; Notable for the following components:   Glucose, Bld 155 (*)    Creatinine, Ser 1.44 (*)    GFR calc non Af Amer 49 (*)    GFR calc Af Amer 56 (*)      All other components within normal limits  URINE CULTURE  I-STAT TROPONIN, ED    EKG None  Radiology Ct Renal Stone Study  Result Date: 04/29/2018 CLINICAL DATA:  Left flank pain for 3 days. EXAM: CT ABDOMEN AND PELVIS WITHOUT CONTRAST TECHNIQUE: Multidetector CT imaging of the abdomen and pelvis was performed following the standard protocol without IV contrast. COMPARISON:  None. FINDINGS: Lower chest: The lung bases are clear of acute process. No pleural effusion or pulmonary lesions. The heart is normal in size. No pericardial effusion. The distal esophagus and aorta are unremarkable. Hepatobiliary:  No focal hepatic lesions or intrahepatic biliary dilatation. The gallbladder is normal. No common bile duct dilatation. Pancreas: No mass, inflammation or ductal dilatation. Spleen: Normal size.  No focal lesions. Adrenals/Urinary Tract: The adrenal glands are normal. There is moderate to marked left-sided hydroureteronephrosis down to an obstructing 8 mm calculus located in the mid pelvis just above the acetabulum. No distal ureteral calculi. The right kidney does not demonstrate any renal or ureteral calculi. Suspect parapelvic renal cysts. No bladder calculi or obvious bladder mass without contrast. No worrisome renal lesions without contrast. Stomach/Bowel: The stomach, duodenum, small bowel and colon are grossly normal without oral contrast. No inflammatory changes, mass lesions or obstructive findings. The terminal ileum and appendix are normal. Moderate descending and sigmoid colon diverticulosis without findings for acute diverticulitis. Vascular/Lymphatic: Moderate atherosclerotic calcifications involving the aorta and branch vessel ostia. No aneurysm. Scattered mesenteric and retroperitoneal lymph nodes but no mass or overt adenopathy. Reproductive: The prostate gland and seminal vesicles are unremarkable. Mild prostate gland enlargement. Other: No pelvic mass or adenopathy. No free pelvic  fluid collections. No inguinal mass or adenopathy. No abdominal wall hernia or subcutaneous lesions. Musculoskeletal: No significant bony findings. There are bilateral pars defects at L5 with a grade 1 spondylolisthesis. IMPRESSION: 1. 8 mm distal left ureteral calculus at the mid pelvic level causing high-grade obstruction with left-sided hydroureteronephrosis. 2. No renal calculi or bladder calculi. 3. No other acute abdominal/pelvic findings, mass lesions or adenopathy. 4. Moderate atherosclerotic calcifications involving the aorta and iliac arteries. Electronically Signed   By: Marijo Sanes M.D.   On: 04/29/2018 08:57    Procedures Procedures (including critical care time)  Medications Ordered in ED Medications  iohexol (OMNIPAQUE) 300 MG/ML solution 100 mL (has no administration in time range)  morphine 4 MG/ML injection 4 mg (4 mg Intravenous Given 04/29/18 0740)  ondansetron (ZOFRAN) injection 4 mg (4 mg Intravenous Given 04/29/18 0740)  sodium chloride 0.9 % bolus 500 mL (0 mLs Intravenous Stopped 04/29/18 0821)  HYDROmorphone (DILAUDID) injection 1 mg (1 mg Intravenous Given 04/29/18 0749)  promethazine (PHENERGAN) injection 12.5 mg (12.5 mg Intravenous Given 04/29/18 0954)  fentaNYL (SUBLIMAZE) injection 100 mcg (100 mcg Intravenous Given 04/29/18 1018)  ondansetron (ZOFRAN) injection 4 mg (4 mg Intravenous Given 04/29/18 1138)  HYDROmorphone (DILAUDID) injection 0.5 mg (0.5 mg Intravenous Given 04/29/18 1141)  promethazine (PHENERGAN) injection 6.25 mg (6.25 mg Intravenous Given 04/29/18 1307)  HYDROmorphone (DILAUDID) injection 0.5 mg (0.5 mg Intravenous Given 04/29/18 1308)     Initial Impression / Assessment and Plan / ED Course  I have reviewed the triage vital signs and the nursing notes.  Pertinent labs & imaging results that were available during my care of the patient were reviewed by me and considered in my medical decision making (see chart for details).     Patient  presenting for evaluation of flank pain.  Physical exam shows elderly male who appears very uncomfortable.  Heart rate low around mid 40s.  Patient without chest pain or shortness of breath.  Considering location of his pain, and course of his pain, most likely kidney stone.  Also consider Pilo.  Consider dissection, however lower suspicion at this time.  Obtain urine, urine culture, labs, troponin, EKG, and imaging for further evaluation.  Case discussed with attending, Dr. Nolon Lennert evaluated the patient.  Zofran and morphine for pain control.  Pain is unrelieved by morphine, will give a dose of Dilaudid.  Labs with mild leukocytosis at 11.7.  Creatinine mildly elevated  at 1.44, baseline 1.2.  UA without obvious infection, does show large blood.  Likely kidney stone.  Heart rate remained stable in the mid to upper 40s.  Troponin negative.  EKG without STEMI.  CT pending.  While patient remains bradycardic, he remains without chest pain or shortness of breath.  This may be a more chronic issue.  Will have patient follow-up with cardiology after discharge for further evaluation.  CT abdomen pelvis shows 8 mm stone with high-grade obstruction and hydronephrosis of the left side.  Considering patient's age, high-grade obstruction, and his severe pain, will consult with urology.  Discussed with Dr. Gloriann Loan, who recommends attempted symptom control, and follow-up in the office today at 1230.  If symptoms are uncontrolled, Dr. Gloriann Loan requests repeat phone call.  On reevaluation after the Dilaudid, patient is feeling slightly better.   Pain and vomiting return, fentanyl and Phenergan given.  Attempt improved for about 30 minutes, until patient had continued pain and vomiting.  Another dose of Dilaudid and Zofran given.  As multiple rounds of medicine have needed for symptom control, patient remains with symptoms, will be consult with urology.  Discussed with Dr. Gloriann Loan, who recommends transfer to [facility] long ED, where he  will evaluate the patient and give further recommendations for management.  Discussed with Dr. Stark Jock from [facility] long ED, who accepts the patient in transfer.  Dr. Gloriann Loan requests to be notified when patient arrives at [facility] long.  Plan discussed with patient and wife, who are agreeable. Pt to be transferred via carelink.  Final Clinical Impressions(s) / ED Diagnoses   Final diagnoses:  Kidney stone on left side  Bradycardia    ED Discharge Orders    None       Franchot Heidelberg, PA-C 04/29/18 1329    Little, Wenda Overland, MD 04/30/18 [phone]

## 2018-04-29 NOTE — H&P (Signed)
H&P Physician requesting consult: Isla Pence, MD  Chief Complaint: Left ureteral calculus  History of Present Illness: 72 year old male presented to the emergency department earlier today with severe left-sided abdominal pain.  CT scan revealed a 8 mm distal left ureteral calculus with upstream hydronephrosis.  Pain was unable to be completely controlled and therefore he was transferred to [facility] long for definitive management of the stone.  The patient has been afebrile with vital signs stable.  He had mild AK I with a creatinine of 1.44.  White blood cell count 11.7.  Analysis is not consistent with infection.  He feels a little bit better currently but had a very rough day secondary to the pain.  Past Medical History:  Diagnosis Date  . [place] DISEASE, CERVICAL 11/15/2008  . ERECTILE DYSFUNCTION 11/14/2007  . HYPERLIPIDEMIA 11/14/2007  . Hypertension    Past Surgical History:  Procedure Laterality Date  . BELPHAROPTOSIS REPAIR    . FINGER SURGERY    . HERNIA REPAIR    . KNEE ARTHROSCOPY      Home Medications:  Medications Prior to Admission  Medication Sig Dispense Refill Last Dose  . glucosamine-chondroitin 500-400 MG tablet Take 2 tablets by mouth daily.    04/28/2018 at Unknown time  . hydrochlorothiazide (HYDRODIURIL) 25 MG tablet Take 1 tablet (25 mg total) by mouth daily. 100 tablet 4 04/28/2018 at Unknown time  . ibuprofen (ADVIL,MOTRIN) 200 MG tablet Take 200 mg by mouth every 6 (six) hours as needed.     Past Week at Unknown time  . Krill Oil 300 MG CAPS Take by mouth.   04/28/2018 at Unknown time  . Lysine 1000 MG TABS Take 1,000 mg by mouth daily.   04/28/2018 at Unknown time  . Nutritional Supplements (CHOLESTEROL DEFENSE PO) Take 1 tablet by mouth daily.   04/28/2018 at Unknown time  . omeprazole (PRILOSEC) 20 MG capsule Take 1 capsule (20 mg total) by mouth daily. 100 capsule 4 04/28/2018 at Unknown time  . tamsulosin (FLOMAX) 0.4 MG CAPS capsule Take 1 capsule (0.4 mg  total) by mouth daily. 100 capsule 4 04/28/2018 at Unknown time  . HYDROcodone-homatropine (HYCODAN) 5-1.5 MG/5ML syrup Take 5 mLs by mouth every 8 (eight) hours as needed. (Patient not taking: Reported on 04/29/2018) 240 mL 0 Not Taking at Unknown time  . sildenafil (REVATIO) 20 MG tablet 1-2 tablets 2-3 hours prior to sex (Patient not taking: Reported on 04/29/2018) 30 tablet 11 Not Taking at Unknown time  . simvastatin (ZOCOR) 20 MG tablet One half tab daily at bedtime Monday Wednesday Friday (Patient not taking: Reported on 04/29/2018) 90 tablet 3 Not Taking at Unknown time   Allergies:  Allergies  Allergen Reactions  . Simvastatin     myalgia    Family History  Problem Relation Age of Onset  . Cancer Father   . Colon cancer Neg Hx    Social History:  reports that he has been smoking cigars. He has never used smokeless tobacco. He reports that he drinks about 7.0 standard drinks of alcohol per week. He reports that he does not use drugs.  ROS: A complete review of systems was performed.  All systems are negative except for pertinent findings as noted. ROS   Physical Exam:  Vital signs in last 24 hours: Temp:  [98.2 F (36.8 C)] 98.2 F (36.8 C) (12/10 1705) Pulse Rate:  [45-52] 52 (12/10 1731) Resp:  [12-18] 15 (12/10 1705) BP: (123-163)/(75-82) 151/79 (12/10 1705) SpO2:  [98 %-100 %]  98 % (12/10 1705) Weight:  [94.6 kg] 94.6 kg (12/10 1649) General:  Alert and oriented, No acute distress HEENT: Normocephalic, atraumatic Neck: No JVD or lymphadenopathy Cardiovascular: Regular rate and rhythm Lungs: Regular rate and effort Abdomen: Soft, nontender, nondistended, no abdominal masses Back: No CVA tenderness Extremities: No edema Neurologic: Grossly intact  Laboratory Data:  Results for orders placed or performed during the hospital encounter of 04/29/18 (from the past 24 hour(s))  Urinalysis, Routine w reflex microscopic- may I&O cath if menses     Status: Abnormal    Collection Time: 04/29/18  6:49 AM  Result Value Ref Range   Color, Urine YELLOW YELLOW   APPearance CLEAR CLEAR   Specific Gravity, Urine 1.020 1.005 - 1.030   pH 5.0 5.0 - 8.0   Glucose, UA NEGATIVE NEGATIVE mg/dL   Hgb urine dipstick LARGE (A) NEGATIVE   Bilirubin Urine NEGATIVE NEGATIVE   Ketones, ur NEGATIVE NEGATIVE mg/dL   Protein, ur NEGATIVE NEGATIVE mg/dL   Nitrite NEGATIVE NEGATIVE   Leukocytes, UA NEGATIVE NEGATIVE   RBC / HPF >50 (H) 0 - 5 RBC/hpf   WBC, UA 0-5 0 - 5 WBC/hpf   Bacteria, UA RARE (A) NONE SEEN   Squamous Epithelial / LPF 0-5 0 - 5   Mucus PRESENT   CBC with Differential     Status: Abnormal   Collection Time: 04/29/18  6:49 AM  Result Value Ref Range   WBC 11.7 (H) 4.0 - 10.5 K/uL   RBC 4.48 4.22 - 5.81 MIL/uL   Hemoglobin 13.8 13.0 - 17.0 g/dL   HCT 42.9 39.0 - 52.0 %   MCV 95.8 80.0 - 100.0 fL   MCH 30.8 26.0 - 34.0 pg   MCHC 32.2 30.0 - 36.0 g/dL   RDW 12.2 11.5 - 15.5 %   Platelets 285 150 - 400 K/uL   nRBC 0.0 0.0 - 0.2 %   Neutrophils Relative % 81 %   Neutro Abs 9.5 (H) 1.7 - 7.7 K/uL   Lymphocytes Relative 13 %   Lymphs Abs 1.5 0.7 - 4.0 K/uL   Monocytes Relative 6 %   Monocytes Absolute 0.7 0.1 - 1.0 K/uL   Eosinophils Relative 0 %   Eosinophils Absolute 0.0 0.0 - 0.5 K/uL   Basophils Relative 0 %   Basophils Absolute 0.0 0.0 - 0.1 K/uL   Immature Granulocytes 0 %   Abs Immature Granulocytes 0.04 0.00 - 0.07 K/uL  Comprehensive metabolic panel     Status: Abnormal   Collection Time: 04/29/18  6:49 AM  Result Value Ref Range   Sodium 142 135 - 145 mmol/L   Potassium 3.7 3.5 - 5.1 mmol/L   Chloride 105 98 - 111 mmol/L   CO2 26 22 - 32 mmol/L   Glucose, Bld 155 (H) 70 - 99 mg/dL   BUN 18 8 - 23 mg/dL   Creatinine, Ser 1.44 (H) 0.61 - 1.24 mg/dL   Calcium 8.9 8.9 - 10.3 mg/dL   Total Protein 7.0 6.5 - 8.1 g/dL   Albumin 3.8 3.5 - 5.0 g/dL   AST 26 15 - 41 U/L   ALT 21 0 - 44 U/L   Alkaline Phosphatase 59 38 - 126 U/L   Total  Bilirubin 0.7 0.3 - 1.2 mg/dL   GFR calc non Af Amer 49 (L) >60 mL/min   GFR calc Af Amer 56 (L) >60 mL/min   Anion gap 11 5 - 15  I-Stat Troponin, ED (not at [facility])  Status: None   Collection Time: 04/29/18  8:53 AM  Result Value Ref Range   Troponin i, poc 0.00 0.00 - 0.08 ng/mL   Comment 3           No results found for this or any previous visit (from the past 240 hour(s)). Creatinine: Recent Labs    04/29/18 0649  CREATININE 1.44*   CT scan personally reviewed and as discussed in history of present illness  Impression/Assessment:  Left ureteral calculus Renal colic Left hydronephrosis secondary to ureteral calculus  Plan:  Proceed to the operating room for cystoscopy with left ureteroscopy, laser lithotripsy, ureteral stent placement I discussed different complications including but not limited to bleeding, infection, injury to surrounding structures including ureteral transection, need for additional procedures.  Marton Redwood, III 04/29/2018, 6:05 PM

## 2018-04-29 NOTE — Transfer of Care (Signed)
Immediate Anesthesia Transfer of Care Note  Patient: Michael Compton  Procedure(s) Performed: CYSTOSCOPY/LEFT URETEROSCOPY/HOLMIUM LASER/STENT PLACEMENT (Left )  Patient Location: PACU  Anesthesia Type:General  Level of Consciousness: awake, alert , oriented and patient cooperative  Airway & Oxygen Therapy: Patient Spontanous Breathing and Patient connected to face mask oxygen  Post-op Assessment: Report given to RN and Post -op Vital signs reviewed and stable  Post vital signs: Reviewed and stable  Last Vitals:  Vitals Value Taken Time  BP 136/84 04/29/2018  7:42 PM  Temp    Pulse 61 04/29/2018  7:45 PM  Resp 15 04/29/2018  7:45 PM  SpO2 100 % 04/29/2018  7:45 PM  Vitals shown include unvalidated device data.  Last Pain:  Vitals:   04/29/18 1733  TempSrc:   PainSc: 6       Patients Stated Pain Goal: 2 (80/22/33 6122)  Complications: No apparent anesthesia complications

## 2018-04-29 NOTE — Anesthesia Preprocedure Evaluation (Addendum)
Anesthesia Evaluation  Patient identified by MRN, date of birth, ID band Patient awake    Reviewed: Allergy & Precautions, NPO status , Patient's Chart, lab work & pertinent test results  Airway Mallampati: II  TM Distance: >3 FB     Dental   Pulmonary Current Smoker,    breath sounds clear to auscultation       Cardiovascular hypertension,  Rhythm:Regular Rate:Normal     Neuro/Psych    GI/Hepatic Neg liver ROS, History noted. CG   Endo/Other  negative endocrine ROS  Renal/GU negative Renal ROS     Musculoskeletal  (+) Arthritis ,   Abdominal   Peds  Hematology   Anesthesia Other Findings   Reproductive/Obstetrics                             Anesthesia Physical Anesthesia Plan  ASA: III  Anesthesia Plan: General   Post-op Pain Management:    Induction: Intravenous, Rapid sequence and Cricoid pressure planned  PONV Risk Score and Plan: 2 and Ondansetron, Dexamethasone, Midazolam and Treatment may vary due to age or medical condition  Airway Management Planned: Oral ETT  Additional Equipment:   Intra-op Plan:   Post-operative Plan: Possible Post-op intubation/ventilation  Informed Consent: I have reviewed the patients History and Physical, chart, labs and discussed the procedure including the risks, benefits and alternatives for the proposed anesthesia with the patient or authorized representative who has indicated his/her understanding and acceptance.   Dental advisory given  Plan Discussed with: CRNA and Anesthesiologist  Anesthesia Plan Comments:        Anesthesia Quick Evaluation

## 2018-04-29 NOTE — ED Triage Notes (Signed)
Pt began having L flank pain and nausea last night, reports flank pain has increased since it began. Pt denies any blood in urine but states it looked very dark. Pt pacing in triage room, appears very uncomfortable.

## 2018-04-29 NOTE — Anesthesia Procedure Notes (Signed)
Procedure Name: Intubation Date/Time: 04/29/2018 6:50 PM Performed by: West Pugh, CRNA Pre-anesthesia Checklist: Patient identified, Emergency Drugs available, Suction available, Patient being monitored and Timeout performed Patient Re-evaluated:Patient Re-evaluated prior to induction Oxygen Delivery Method: Circle system utilized Preoxygenation: Pre-oxygenation with 100% oxygen Induction Type: IV induction, Rapid sequence and Cricoid Pressure applied Laryngoscope Size: Mac and 4 Grade View: Grade I Tube type: Oral Tube size: 7.5 mm Number of attempts: 1 Airway Equipment and Method: Stylet Placement Confirmation: ETT inserted through vocal cords under direct vision,  positive ETCO2,  CO2 detector and breath sounds checked- equal and bilateral Secured at: 22 cm Tube secured with: Tape Dental Injury: Teeth and Oropharynx as per pre-operative assessment

## 2018-04-29 NOTE — ED Notes (Signed)
Pt placed on 2L of O2 after medication admin from RN. Pt O2 dropped to 77% once drowsy. Pt O2 now 99%

## 2018-04-29 NOTE — ED Provider Notes (Signed)
Pt transferred here from [facility] to see Dr. Gloriann Loan from urology.  Pt has been having left flank pain for 3 days with nausea.  He does not have a previous hx of kidney stones.  CT done at [facility] shows:  IMPRESSION: 1. 8 mm distal left ureteral calculus at the mid pelvic level causing high-grade obstruction with left-sided hydroureteronephrosis. 2. No renal calculi or bladder calculi. 3. No other acute abdominal/pelvic findings, mass lesions or adenopathy. 4. Moderate atherosclerotic calcifications involving the aorta and iliac arteries.  Pain and nausea uncontrolled, so he was d/w Dr. Gloriann Loan who recommended that pt come here.  Pt was d/w Dr. Gloriann Loan who has posted him for the OR today.  He will admit him to his service.  Pain and nausea now are under control.   Isla Pence, MD 04/29/18 [phone]

## 2018-04-29 NOTE — Plan of Care (Signed)
Pt stable and ambulatory before discharge. Pt tolerating fluids well and has voided twice since coming to the floor. No complaints of pain. Pt with family.

## 2018-04-29 NOTE — Discharge Summary (Addendum)
[facility] Discharge Summary  Admit date: 04/29/2018  Discharge date and time: 04/29/18   Discharge to: Home  Discharge Service: Urology  Discharge Attending Physician: Link Snuffer, MD  Discharge  Diagnoses: Left obstructing ureteral stone  Secondary Diagnosis: Active Problems:   Ureteral calculus   OR Procedures: Procedure(s): CYSTOSCOPY/LEFT URETEROSCOPY/HOLMIUM LASER/STENT PLACEMENT 04/29/2018   Ancillary Procedures: None   Discharge Day Services: The patient was seen and examined by the Urology team both in the morning and immediately prior to discharge.  Vital signs and laboratory values were stable and within normal limits. The physical exam was benign and unchanged and all surgical wounds were examined.  Discharge instructions were explained and all questions answered.  Subjective  No acute events overnight. Pain Controlled. No fever or chills.  Objective Patient Vitals for the past 8 hrs:  BP Temp Temp src Pulse Resp SpO2 Height Weight  04/29/18 1945 136/84 98 F (36.7 C) - (!) 58 19 100 % - -  04/29/18 1731 - - - (!) 52 - - - -  04/29/18 1705 (!) 151/79 98.2 F (36.8 C) Oral (!) 49 15 98 % - -  04/29/18 1649 - - - - - - 5\' 9"  (1.753 m) 94.6 kg  04/29/18 1400 (!) 163/79 - - (!) 49 15 100 % - -  04/29/18 1400 (!) 150/75 - - (!) 46 - 100 % - -  04/29/18 1302 (!) 148/82 - - (!) 48 18 100 % - -   Total I/O In: 1100 [I.V.:1000; IV Piggyback:100] Out: -   General Appearance:        No acute distress Lungs:                       Normal work of breathing on room air Heart:                                Regular rate and rhythm Abdomen:                         Soft, non-tender, non-distended Extremities:                      Warm and well perfused   Hospital Course:  The patient underwent cystoscopy, left ureteroscopy, laser lithotripsy, left ureteral stent placement on 04/29/2018.  The patient tolerated the procedure well, was extubated in the OR, and  afterwards was taken to the PACU for routine post-surgical care. When stable the patient was transferred to the floor. The patient did well postoperatively. The patient's diet was slowly advanced and at the time of discharge was tolerating a regular diet. The patient was discharged home Day of Surgery, at which point was meeting PACU discharge criteria, was able to void spontaneously, have adequate pain control with P.O. pain medication, and could ambulate without difficulty. The patient will follow up with [place] for post op check and ureteral stent removal.   Condition at Discharge: Improved  Discharge Medications:  Allergies as of 04/29/2018      Reactions   Simvastatin    myalgia      Medication List    STOP taking these medications   HYDROcodone-homatropine 5-1.5 MG/5ML syrup Commonly known as:  HYCODAN     TAKE these medications   cephALEXin 500 MG capsule Commonly known as:  KEFLEX Take 1 capsule (500 mg total) by mouth  2 (two) times daily for 3 days.   CHOLESTEROL DEFENSE PO Take 1 tablet by mouth daily.   glucosamine-chondroitin 500-400 MG tablet Take 2 tablets by mouth daily.   hydrochlorothiazide 25 MG tablet Commonly known as:  HYDRODIURIL Take 1 tablet (25 mg total) by mouth daily.   ibuprofen 200 MG tablet Commonly known as:  ADVIL,MOTRIN Take 200 mg by mouth every 6 (six) hours as needed.   Krill Oil 300 MG Caps Take by mouth.   Lysine 1000 MG Tabs Take 1,000 mg by mouth daily.   omeprazole 20 MG capsule Commonly known as:  PRILOSEC Take 1 capsule (20 mg total) by mouth daily.   oxyCODONE 5 MG immediate release tablet Commonly known as:  Oxy IR/ROXICODONE Take 1 tablet (5 mg total) by mouth every 4 (four) hours as needed for up to 5 days for severe pain.   sildenafil 20 MG tablet Commonly known as:  REVATIO 1-2 tablets 2-3 hours prior to sex   simvastatin 20 MG tablet Commonly known as:  ZOCOR One half tab daily at bedtime Monday Wednesday Friday    tamsulosin 0.4 MG Caps capsule Commonly known as:  FLOMAX Take 1 capsule (0.4 mg total) by mouth daily.

## 2018-04-29 NOTE — Progress Notes (Signed)
Pt ambulatory and stable at time of discharge. Pt able to void and drink and tolerated well. No complaints of pain. Pt and family were read the discharge instructions with all questions answered. No further questions. Pt left floor to home with family via wheelchair.

## 2018-04-29 NOTE — Anesthesia Postprocedure Evaluation (Signed)
Anesthesia Post Note  Patient: Michael Compton  Procedure(s) Performed: CYSTOSCOPY/LEFT URETEROSCOPY/HOLMIUM LASER/STENT PLACEMENT (Left )     Patient location during evaluation: PACU Anesthesia Type: General Level of consciousness: awake Pain management: pain level controlled Vital Signs Assessment: post-procedure vital signs reviewed and stable Respiratory status: spontaneous breathing Cardiovascular status: stable Postop Assessment: no headache Anesthetic complications: no    Last Vitals:  Vitals:   04/29/18 1731 04/29/18 1945  BP:  136/84  Pulse: (!) 52 (!) 58  Resp:  19  Temp:  36.7 C  SpO2:  100%    Last Pain:  Vitals:   04/29/18 1945  TempSrc:   PainSc: 0-No pain                 Aulden Calise

## 2018-04-29 NOTE — Op Note (Signed)
Operative Note  Preoperative diagnosis:  1.  Left ureteral calculus  Postoperative diagnosis: 1.  Left ureteral calculus  Procedure(s): 1.  Cystoscopy with left retrograde pyelogram, left ureteroscopy with laser lithotripsy, ureteral stent placement  Surgeon: Link Snuffer, MD  Assistants:Kathryn Carlean Purl, MD  Anesthesia: General  Complications: None immediate  EBL: Normal  Specimens: 1.  None  Drains/Catheters: 1.  6 x 26 double-J ureteral stent  Intraoperative findings: 1.  Normal urethra and bladder 2.  8 mm left distal ureteral calculus fragmented to tiny fragments 3.  Left retrograde pyelogram revealed mild upstream hydronephrosis with no significant filling defect after ureteroscopy  Indication: 72 year old male presented with a 8 mm distal left ureteral calculus and severe pain.  Pain was unable to be controlled and therefore he presents for the previously mentioned operation.  Description of procedure:  The patient was identified and consent was obtained.  The patient was taken to the operating room and placed in the supine position.  The patient was placed under general anesthesia.  Perioperative antibiotics were administered.  The patient was placed in dorsal lithotomy.  Patient was prepped and draped in a standard sterile fashion and a timeout was performed.  A 21 French rigid cystoscope was advanced into the urethra and into the bladder.  Complete cystoscopy was performed with no abnormal findings.  The left ureter was cannulated with a sensor wire and advanced up to the kidney under fluoroscopic guidance.  A semirigid ureteroscope was advanced alongside the wire up to the stone of interest which was fragmented to tiny fragments.  Retrograde pyelogram was then performed through the scope with findings noted above.  The scope was withdrawn and no significant injury or other stones were seen.  The wire was then backloaded onto the rigid cystoscope which was advanced into the  bladder.  A 6 x 26 double-J ureteral stent was placed in a standard fashion followed by removal of the wire.  Fluoroscopy confirmed proximal placement and direct visualization confirmed a good coil within the bladder.  The bladder was drained and the scope withdrawn.  The patient tolerated the procedure well was stable postoperatively.  Plan: Discharge patient today.  He can follow-up in 1 week for stent removal.

## 2018-04-29 NOTE — Discharge Instructions (Signed)
1. You may see some blood in the urine and may have some burning with urination for 48-72 hours. You also may notice that you have to urinate more frequently or urgently after your procedure which is normal.  2. You should call should you develop an inability urinate, fever > 101, persistent nausea and vomiting that prevents you from eating or drinking to stay hydrated.  3. If you have a stent, you will likely urinate more frequently and urgently until the stent is removed and you may experience some discomfort/pain in the lower abdomen and flank especially when urinating. You may take pain medication prescribed to you if needed for pain. You may also intermittently have blood in the urine until the stent is removed. 4. If you have a catheter, you will be taught how to take care of the catheter by the nursing staff prior to discharge from the hospital.  You may periodically feel a strong urge to void with the catheter in place.  This is a bladder spasm and most often can occur when having a bowel movement or moving around. It is typically self-limited and usually will stop after a few minutes.  You may use some Vaseline or Neosporin around the tip of the catheter to reduce friction at the tip of the penis. You may also see some blood in the urine.  A very small amount of blood can make the urine look quite red.  As long as the catheter is draining well, there usually is not a problem.  However, if the catheter is not draining well and is bloody, you should call the office [phone]) to notify us. 5. Take tylenol scheduled every 6 hours for pain. For breakthrough pain, take the prescribed oxycodone as needed. Do not exceed 4 g of tylenol in 24 hours.

## 2018-04-29 NOTE — ED Notes (Signed)
He has just arrived on our unit from [facility]. He is drowsy and in no distress. He has 1 sm. Emesis upon arrival. He has recently received pain and nausea medicine shortly before arrival.

## 2018-04-30 ENCOUNTER — Encounter (HOSPITAL_COMMUNITY): Payer: Self-pay | Admitting: Urology

## 2018-04-30 LAB — URINE CULTURE: Culture: NO GROWTH

## 2018-05-07 DIAGNOSIS — N201 Calculus of ureter: Secondary | ICD-10-CM | POA: Diagnosis not present

## 2018-05-12 ENCOUNTER — Encounter: Payer: PPO | Admitting: Family Medicine

## 2018-05-19 ENCOUNTER — Telehealth: Payer: Self-pay | Admitting: Family Medicine

## 2018-05-19 NOTE — Telephone Encounter (Signed)
Dr Sherren Mocha has been removed as PCP per pt.

## 2018-05-19 NOTE — Telephone Encounter (Signed)
Copied from [place] [id number]. Topic: General - Other >> May 19, 2018  2:50 PM Wynetta Emery, [place] C wrote: Reason for CRM: pt is no longer a pt with [facility]. He has switched to a different office due to his PCP retiring. Pt would like to be sure that all of the letters/reminders stop coming to him

## 2018-05-22 ENCOUNTER — Other Ambulatory Visit: Payer: Self-pay | Admitting: Internal Medicine

## 2018-05-22 ENCOUNTER — Other Ambulatory Visit: Payer: Self-pay | Admitting: *Deleted

## 2018-05-22 MED ORDER — TAMSULOSIN HCL 0.4 MG PO CAPS: 0.4000 mg | ORAL_CAPSULE | Freq: Every day | ORAL | 0 refills | Status: AC

## 2018-05-26 ENCOUNTER — Encounter: Payer: PPO | Admitting: Family Medicine

## 2018-06-13 DIAGNOSIS — R252 Cramp and spasm: Secondary | ICD-10-CM | POA: Diagnosis not present

## 2018-06-13 DIAGNOSIS — N400 Enlarged prostate without lower urinary tract symptoms: Secondary | ICD-10-CM | POA: Diagnosis not present

## 2018-06-13 DIAGNOSIS — E7849 Other hyperlipidemia: Secondary | ICD-10-CM | POA: Diagnosis not present

## 2018-06-13 DIAGNOSIS — R001 Bradycardia, unspecified: Secondary | ICD-10-CM | POA: Diagnosis not present

## 2018-06-13 DIAGNOSIS — Z1331 Encounter for screening for depression: Secondary | ICD-10-CM | POA: Diagnosis not present

## 2018-06-13 DIAGNOSIS — I10 Essential (primary) hypertension: Secondary | ICD-10-CM | POA: Diagnosis not present

## 2018-06-13 DIAGNOSIS — I700 Atherosclerosis of aorta: Secondary | ICD-10-CM | POA: Diagnosis not present

## 2018-06-13 DIAGNOSIS — Z6831 Body mass index (BMI) 31.0-31.9, adult: Secondary | ICD-10-CM | POA: Diagnosis not present

## 2018-06-19 ENCOUNTER — Ambulatory Visit: Payer: PPO

## 2018-06-19 DIAGNOSIS — N201 Calculus of ureter: Secondary | ICD-10-CM | POA: Diagnosis not present

## 2018-12-24 DIAGNOSIS — Z125 Encounter for screening for malignant neoplasm of prostate: Secondary | ICD-10-CM | POA: Diagnosis not present

## 2018-12-24 DIAGNOSIS — R82998 Other abnormal findings in urine: Secondary | ICD-10-CM | POA: Diagnosis not present

## 2018-12-24 DIAGNOSIS — E7849 Other hyperlipidemia: Secondary | ICD-10-CM | POA: Diagnosis not present

## 2018-12-24 DIAGNOSIS — I10 Essential (primary) hypertension: Secondary | ICD-10-CM | POA: Diagnosis not present

## 2018-12-30 DIAGNOSIS — E785 Hyperlipidemia, unspecified: Secondary | ICD-10-CM | POA: Diagnosis not present

## 2018-12-30 DIAGNOSIS — Z0000 Encounter for general adult medical examination without abnormal findings: Secondary | ICD-10-CM | POA: Diagnosis not present

## 2018-12-30 DIAGNOSIS — N400 Enlarged prostate without lower urinary tract symptoms: Secondary | ICD-10-CM | POA: Diagnosis not present

## 2018-12-30 DIAGNOSIS — I10 Essential (primary) hypertension: Secondary | ICD-10-CM | POA: Diagnosis not present

## 2018-12-30 DIAGNOSIS — Z87442 Personal history of urinary calculi: Secondary | ICD-10-CM | POA: Diagnosis not present

## 2018-12-30 DIAGNOSIS — I700 Atherosclerosis of aorta: Secondary | ICD-10-CM | POA: Diagnosis not present

## 2019-03-09 ENCOUNTER — Other Ambulatory Visit: Payer: Self-pay

## 2019-03-09 DIAGNOSIS — Z20822 Contact with and (suspected) exposure to covid-19: Secondary | ICD-10-CM

## 2019-03-11 LAB — NOVEL CORONAVIRUS, NAA: SARS-CoV-2, NAA: DETECTED — AB

## 2019-03-19 DIAGNOSIS — Z20828 Contact with and (suspected) exposure to other viral communicable diseases: Secondary | ICD-10-CM | POA: Diagnosis not present

## 2019-03-30 ENCOUNTER — Other Ambulatory Visit: Payer: Self-pay

## 2019-03-30 DIAGNOSIS — Z20822 Contact with and (suspected) exposure to covid-19: Secondary | ICD-10-CM

## 2019-03-31 LAB — NOVEL CORONAVIRUS, NAA: SARS-CoV-2, NAA: NOT DETECTED

## 2019-04-07 DIAGNOSIS — Z23 Encounter for immunization: Secondary | ICD-10-CM | POA: Diagnosis not present

## 2019-06-15 ENCOUNTER — Ambulatory Visit: Payer: PPO | Attending: Internal Medicine

## 2019-06-15 DIAGNOSIS — Z23 Encounter for immunization: Secondary | ICD-10-CM

## 2019-06-15 NOTE — Progress Notes (Signed)
   Covid-19 Vaccination Clinic  Name:  Michael Compton    MRN: [id number] DOB: Sep 19, 1945  06/15/2019  [name] was observed post Covid-19 immunization for 15 minutes without incidence. He was provided with Vaccine Information Sheet and instruction to access the V-Safe system.   [name] was instructed to call 911 with any severe reactions post vaccine: [name] Difficulty breathing  . Swelling of your face and throat  . A fast heartbeat  . A bad rash all over your body  . Dizziness and weakness    Immunizations Administered    Name Date Dose VIS Date Route   Pfizer COVID-19 Vaccine 06/15/2019 12:54 PM 0.3 mL 05/01/2019 Intramuscular   Manufacturer: [place]   Lot: [id number]   [place]: [id number]

## 2019-07-06 ENCOUNTER — Ambulatory Visit: Payer: PPO | Attending: Internal Medicine

## 2019-07-06 DIAGNOSIS — Z23 Encounter for immunization: Secondary | ICD-10-CM | POA: Insufficient documentation

## 2019-07-06 NOTE — Progress Notes (Signed)
   Covid-19 Vaccination Clinic  Name:  Michael Compton    MRN: [id number] DOB: 1946/05/17  07/06/2019  [name] was observed post Covid-19 immunization for 15 minutes without incidence. He was provided with Vaccine Information Sheet and instruction to access the V-Safe system.   [name] was instructed to call 911 with any severe reactions post vaccine: [name] Difficulty breathing  . Swelling of your face and throat  . A fast heartbeat  . A bad rash all over your body  . Dizziness and weakness    Immunizations Administered    Name Date Dose VIS Date Route   Pfizer COVID-19 Vaccine 07/06/2019 10:53 AM 0.3 mL 05/01/2019 Intramuscular   Manufacturer: [place]   Lot: [id number]   [place]: [id number]

## 2019-08-10 IMAGING — CT CT RENAL STONE PROTOCOL
2 of 4 series · 15 of 46 positions shown, 17 images · non-contrast
Comparison: None.

CLINICAL DATA: Left flank pain for 3 days.

EXAM:
CT ABDOMEN AND PELVIS WITHOUT CONTRAST
TECHNIQUE: Multidetector CT imaging of the abdomen and pelvis was performed
following the standard protocol without IV contrast.

[Series 3: renal stone 5.0 · axial · 0.94mm/px · z∈[+779,+1244]mm · 12 of 107 slices shown, 14 images]
[im 9/107  soft-tissue]
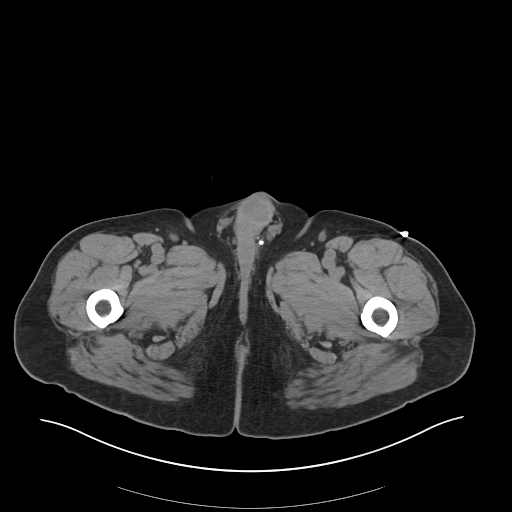
[im 9/107  bone]
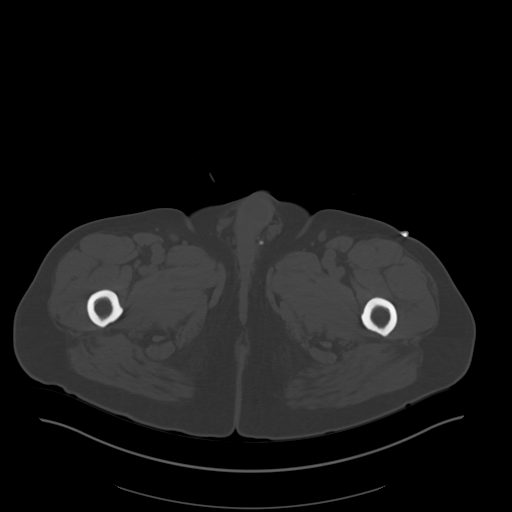
[im 17/107  soft-tissue]
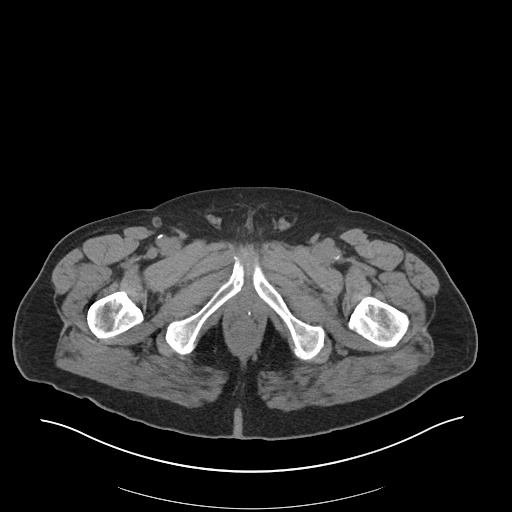
[im 26/107  soft-tissue]
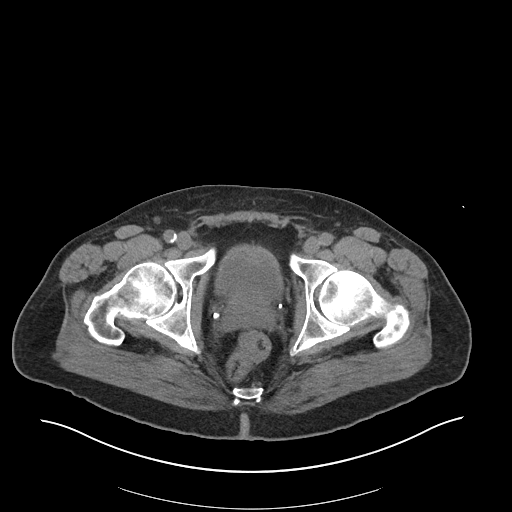
[im 34/107  soft-tissue]
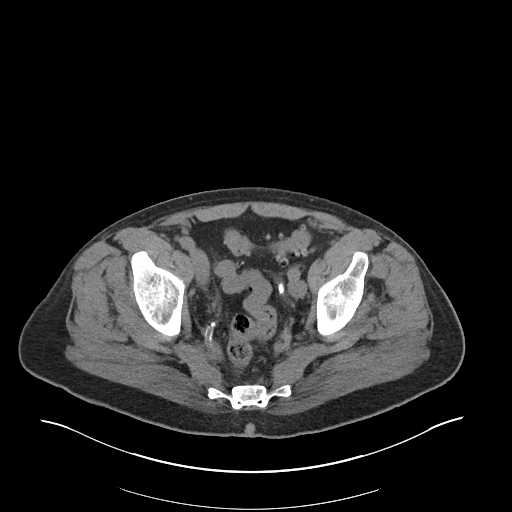
[im 43/107  soft-tissue]
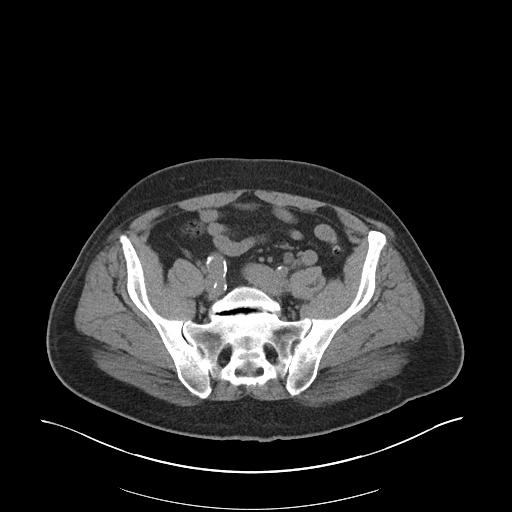
[im 51/107  soft-tissue]
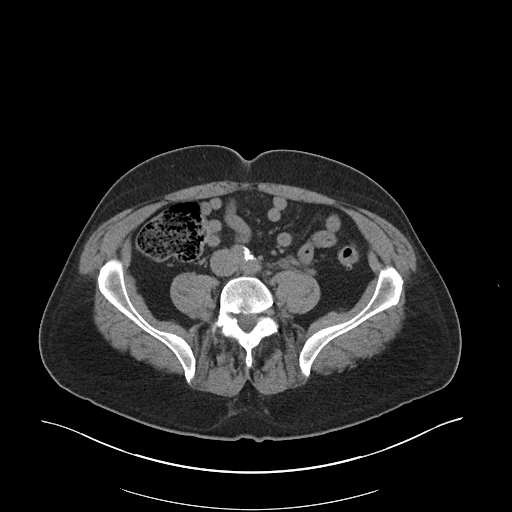
[im 60/107  soft-tissue]
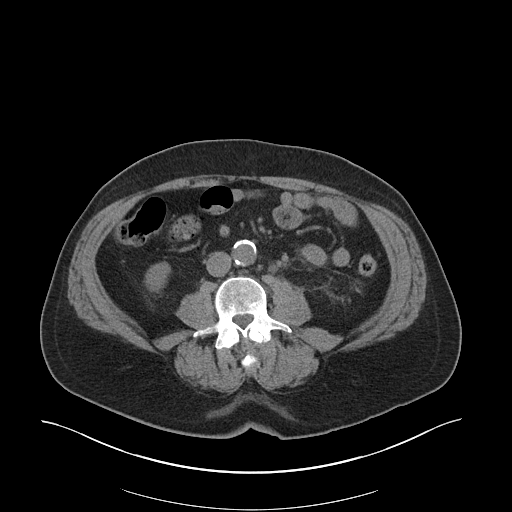
[im 68/107  soft-tissue]
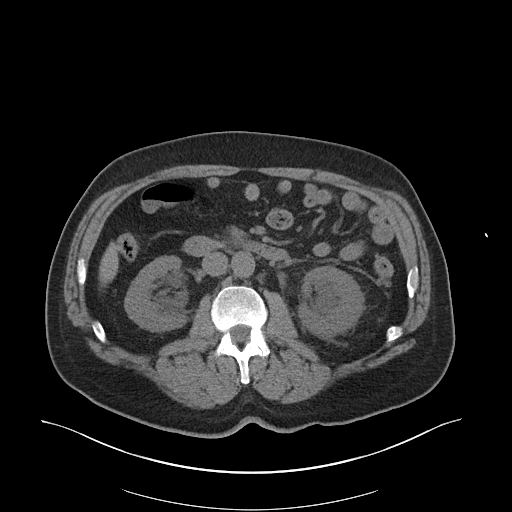
[im 77/107  soft-tissue]
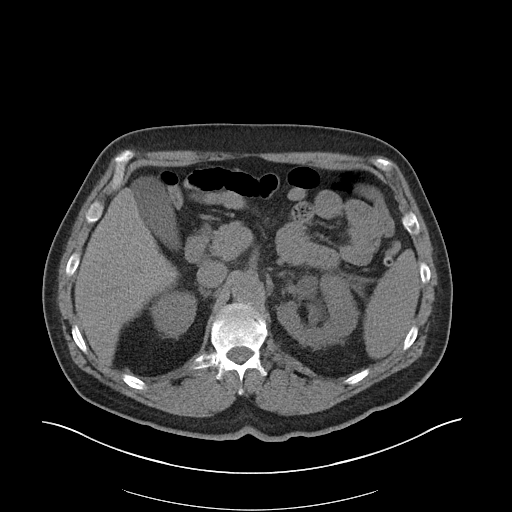
[im 77/107  bone]
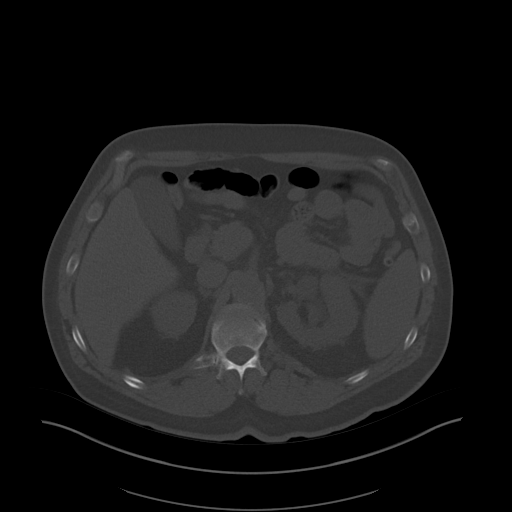
[im 85/107  soft-tissue]
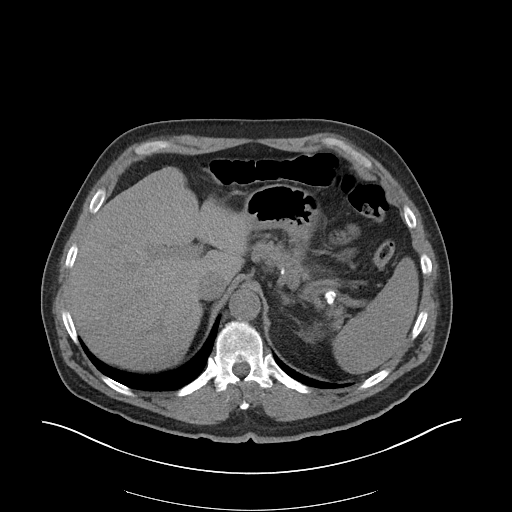
[im 94/107  soft-tissue]
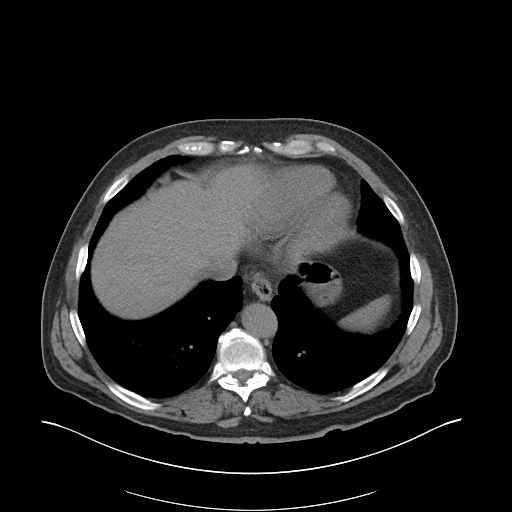
[im 102/107  soft-tissue]
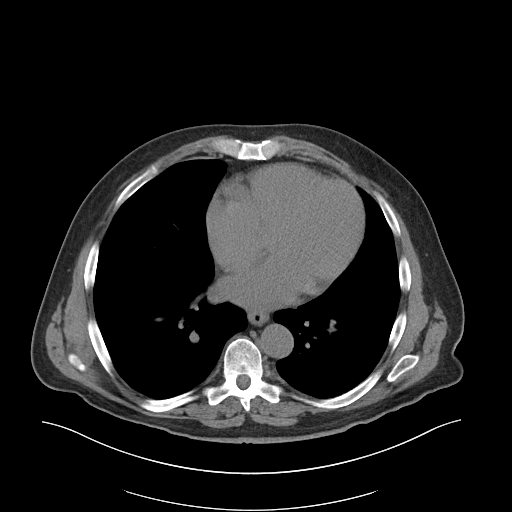

[Series 5: renal stone 3.0 cor · coronal · 1.04mm/px · 3 of 132 slices shown]
[im 44/132  soft-tissue]
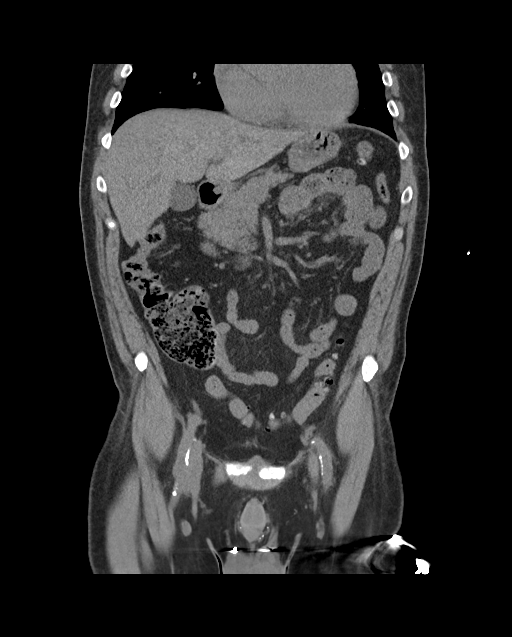
[im 59/132  soft-tissue]
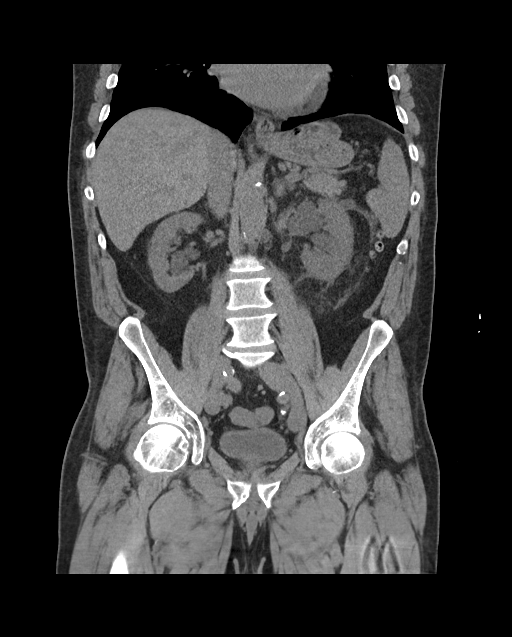
[im 73/132  soft-tissue]
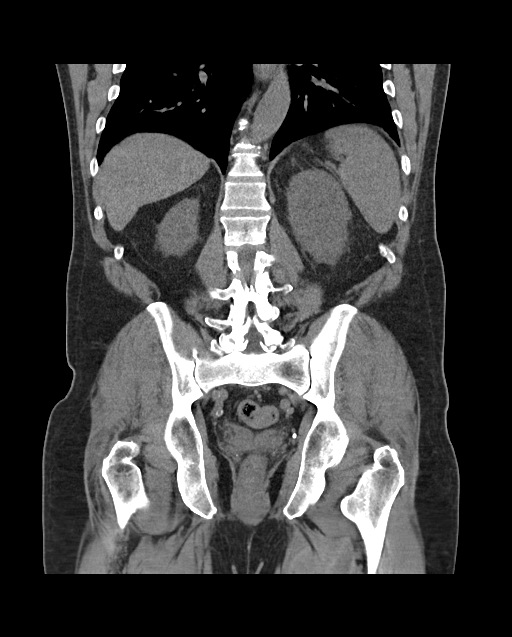

[15 of 46 positions shown; findings below may reference images not displayed]

FINDINGS: Lower chest: The lung bases are clear of acute process. No pleural
effusion or pulmonary lesions. The heart is normal in size. No
pericardial effusion. The distal esophagus and aorta are
unremarkable.

Hepatobiliary: No focal hepatic lesions or intrahepatic biliary
dilatation. The gallbladder is normal. No common bile duct
dilatation.

Pancreas: No mass, inflammation or ductal dilatation.

Spleen: Normal size.  No focal lesions.

Adrenals/Urinary Tract: The adrenal glands are normal.

There is moderate to marked left-sided hydroureteronephrosis down to
an obstructing 8 mm calculus located in the mid pelvis just above
the acetabulum. No distal ureteral calculi.

The right kidney does not demonstrate any renal or ureteral calculi.
Suspect parapelvic renal cysts.

No bladder calculi or obvious bladder mass without contrast. No
worrisome renal lesions without contrast.

Stomach/Bowel: The stomach, duodenum, small bowel and colon are
grossly normal without oral contrast. No inflammatory changes, mass
lesions or obstructive findings. The terminal ileum and appendix are
normal. Moderate descending and sigmoid colon diverticulosis without
findings for acute diverticulitis.

Vascular/Lymphatic: Moderate atherosclerotic calcifications
involving the aorta and branch vessel ostia. No aneurysm. Scattered
mesenteric and retroperitoneal lymph nodes but no mass or overt
adenopathy.

Reproductive: The prostate gland and seminal vesicles are
unremarkable. Mild prostate gland enlargement.

Other: No pelvic mass or adenopathy. No free pelvic fluid
collections. No inguinal mass or adenopathy. No abdominal wall
hernia or subcutaneous lesions.

Musculoskeletal: No significant bony findings. There are bilateral
pars defects at L5 with a grade 1 spondylolisthesis.
IMPRESSION: 1. 8 mm distal left ureteral calculus at the mid pelvic level
causing high-grade obstruction with left-sided
hydroureteronephrosis.
2. No renal calculi or bladder calculi.
3. No other acute abdominal/pelvic findings, mass lesions or
adenopathy.
4. Moderate atherosclerotic calcifications involving the aorta and
iliac arteries.

## 2019-11-06 ENCOUNTER — Encounter: Payer: Self-pay | Admitting: Gastroenterology

## 2019-12-24 ENCOUNTER — Other Ambulatory Visit: Payer: Self-pay

## 2019-12-24 ENCOUNTER — Encounter: Payer: Self-pay | Admitting: Gastroenterology

## 2019-12-24 ENCOUNTER — Ambulatory Visit (AMBULATORY_SURGERY_CENTER): Payer: Self-pay

## 2019-12-24 VITALS — Ht 69.0 in | Wt 215.0 lb

## 2019-12-24 DIAGNOSIS — Z86010 Personal history of colonic polyps: Secondary | ICD-10-CM

## 2019-12-24 NOTE — Progress Notes (Signed)
No egg or soy allergy known to patient  No issues with past sedation with any surgeries or procedures No intubation problems in the past  No FH of Malignant Hyperthermia No diet pills per patient No home 02 use per patient  No blood thinners per patient  Pt denies issues with constipation  No A fib or A flutter  EMMI video via MyChart  COVID 19 guidelines implemented in [place] today with Pt and RN  COVID vaccines completed on 07/2019 per pt;  Due to the COVID-19 pandemic we are asking patients to follow these guidelines. Please only bring one care partner. Please be aware that your care partner may wait in the car in the parking lot or if they feel like they will be too hot to wait in the car, they may wait in the lobby on the 4th floor. All care partners are required to wear a mask the entire time (we do not have any that we can provide them), they need to practice social distancing, and we will do a Covid check for all patient's and care partners when you arrive. Also we will check their temperature and your temperature. If the care partner waits in their car they need to stay in the parking lot the entire time and we will call them on their cell phone when the patient is ready for discharge so they can bring the car to the front of the building. Also all patient's will need to wear a mask into building. 

## 2020-01-07 ENCOUNTER — Ambulatory Visit (AMBULATORY_SURGERY_CENTER): Payer: PPO | Admitting: Gastroenterology

## 2020-01-07 ENCOUNTER — Encounter: Payer: Self-pay | Admitting: Gastroenterology

## 2020-01-07 ENCOUNTER — Other Ambulatory Visit: Payer: Self-pay

## 2020-01-07 VITALS — BP 103/63 | HR 44 | Temp 97.8°F | Resp 17 | Ht 69.0 in | Wt 215.0 lb

## 2020-01-07 DIAGNOSIS — K621 Rectal polyp: Secondary | ICD-10-CM | POA: Diagnosis not present

## 2020-01-07 DIAGNOSIS — Z1211 Encounter for screening for malignant neoplasm of colon: Secondary | ICD-10-CM | POA: Diagnosis not present

## 2020-01-07 DIAGNOSIS — D127 Benign neoplasm of rectosigmoid junction: Secondary | ICD-10-CM | POA: Diagnosis not present

## 2020-01-07 DIAGNOSIS — Z860100 Personal history of colon polyps, unspecified: Secondary | ICD-10-CM

## 2020-01-07 DIAGNOSIS — K635 Polyp of colon: Secondary | ICD-10-CM | POA: Diagnosis not present

## 2020-01-07 DIAGNOSIS — D128 Benign neoplasm of rectum: Secondary | ICD-10-CM

## 2020-01-07 MED ORDER — SODIUM CHLORIDE 0.9 % IV SOLN: 500.0000 mL | Freq: Once | INTRAVENOUS | Status: DC

## 2020-01-07 NOTE — Patient Instructions (Signed)
Information on polyps, hemorrhoids and diverticulosis given to you.  Await pathology results.  Resume previous diet and medications.  Eat a high fiber diet and use Fiber Con tablets.  Take 1-2 tablets each day by mouth.  YOU HAD AN ENDOSCOPIC PROCEDURE TODAY AT [facility]:   Refer to the procedure report that was given to you for any specific questions about what was found during the examination.  If the procedure report does not answer your questions, please call your gastroenterologist to clarify.  If you requested that your care partner not be given the details of your procedure findings, then the procedure report has been included in a sealed envelope for you to review at your convenience later.  YOU SHOULD EXPECT: Some feelings of bloating in the abdomen. Passage of more gas than usual.  Walking can help get rid of the air that was put into your GI tract during the procedure and reduce the bloating. If you had a lower endoscopy (such as a colonoscopy or flexible sigmoidoscopy) you may notice spotting of blood in your stool or on the toilet paper. If you underwent a bowel prep for your procedure, you may not have a normal bowel movement for a few days.  Please Note:  You might notice some irritation and congestion in your nose or some drainage.  This is from the oxygen used during your procedure.  There is no need for concern and it should clear up in a day or so.  SYMPTOMS TO REPORT IMMEDIATELY:   Following lower endoscopy (colonoscopy or flexible sigmoidoscopy):  Excessive amounts of blood in the stool  Significant tenderness or worsening of abdominal pains  Swelling of the abdomen that is new, acute  Fever of 100F or higher   For urgent or emergent issues, a gastroenterologist can be reached at any hour by calling [phone]. Do not use MyChart messaging for urgent concerns.    DIET:  We do recommend a small meal at first, but then you may proceed to your  regular diet.  Drink plenty of fluids but you should avoid alcoholic beverages for 24 hours.  ACTIVITY:  You should plan to take it easy for the rest of today and you should NOT DRIVE or use heavy machinery until tomorrow (because of the sedation medicines used during the test).    FOLLOW UP: Our staff will call the number listed on your records 48-72 hours following your procedure to check on you and address any questions or concerns that you may have regarding the information given to you following your procedure. If we do not reach you, we will leave a message.  We will attempt to reach you two times.  During this call, we will ask if you have developed any symptoms of COVID 19. If you develop any symptoms (ie: fever, flu-like symptoms, shortness of breath, cough etc.) before then, please call [phone].  If you test positive for Covid 19 in the 2 weeks post procedure, please call and report this information to [place].    If any biopsies were taken you will be contacted by phone or by letter within the next 1-3 weeks.  Please call us at [phone] if you have not heard about the biopsies in 3 weeks.    SIGNATURES/CONFIDENTIALITY: You and/or your care partner have signed paperwork which will be entered into your electronic medical record.  These signatures attest to the fact that that the information above on your After Visit Summary has  been reviewed and is understood.  Full responsibility of the confidentiality of this discharge information lies with you and/or your care-partner.

## 2020-01-07 NOTE — Progress Notes (Signed)
A/ox3, pleased with MAC, report to RN 

## 2020-01-07 NOTE — Op Note (Signed)
[facility] Patient Name: Michael Compton Procedure Date: 01/07/2020 9:05 AM MRN: [id number] Endoscopist: Justice Britain , MD Age: 74 Referring MD:  Date of Birth: 01/17/1946 Gender: Male Account #: [id number] Procedure:                Colonoscopy Indications:              High risk colon cancer surveillance: Personal                            history of colonic polyps Medicines:                Monitored Anesthesia Care Procedure:                Pre-Anesthesia Assessment:                           - Prior to the procedure, a History and Physical                            was performed, and patient medications and                            allergies were reviewed. The patient's tolerance of                            previous anesthesia was also reviewed. The risks                            and benefits of the procedure and the sedation                            options and risks were discussed with the patient.                            All questions were answered, and informed consent                            was obtained. Prior Anticoagulants: The patient has                            taken no previous anticoagulant or antiplatelet                            agents. ASA Grade Assessment: II - A patient with                            mild systemic disease. After reviewing the risks                            and benefits, the patient was deemed in                            satisfactory condition to undergo the procedure.  After obtaining informed consent, the colonoscope                            was passed under direct vision. Throughout the                            procedure, the patient's blood pressure, pulse, and                            oxygen saturations were monitored continuously. The                            Colonoscope was introduced through the anus and                            advanced to the the cecum, identified  by palpation.                            The colonoscopy was performed without difficulty.                            The patient tolerated the procedure. The quality of                            the bowel preparation was adequate. The terminal                            ileum, ileocecal valve, appendiceal orifice, and                            rectum were photographed. Scope In: 9:14:17 AM Scope Out: 9:38:40 AM Scope Withdrawal Time: 0 hours 20 minutes 42 seconds  Total Procedure Duration: 0 hours 24 minutes 23 seconds  Findings:                 The digital rectal exam findings include                            hemorrhoids. Pertinent negatives include no                            palpable rectal lesions.                           A large amount of liquid semi-liquid stool was                            found in the entire colon, interfering with                            visualization. Lavage of the area was performed                            using copious amounts, resulting in clearance with  adequate visualization.                           The terminal ileum and ileocecal valve appeared                            normal.                           Two sessile polyps were found in the rectum and                            recto-sigmoid colon. The polyps were 3 to 5 mm in                            size. These polyps were removed with a cold snare.                            Resection and retrieval were complete.                           Many small and large-mouthed diverticula were found                            in the recto-sigmoid colon, sigmoid colon,                            descending colon and transverse colon.                           Normal mucosa was found in the entire colon                            otherwise.                           Non-bleeding non-thrombosed external and internal                            hemorrhoids were found  during retroflexion, during                            perianal exam and during digital exam. The                            hemorrhoids were Grade II (internal hemorrhoids                            that prolapse but reduce spontaneously). Complications:            No immediate complications. Estimated Blood Loss:     Estimated blood loss was minimal. Impression:               - Hemorrhoids found on digital rectal exam.                           -  Stool in the entire examined colon. Lavaged                            extensively to achieve adequate visualization.                           - The examined portion of the ileum was normal.                           - Two 3 to 5 mm polyps in the rectum and at the                            recto-sigmoid colon, removed with a cold snare.                            Resected and retrieved.                           - Diverticulosis in the recto-sigmoid colon, in the                            sigmoid colon, in the descending colon and in the                            transverse colon.                           - Normal mucosa in the entire examined colon                            otherwise.                           - Non-bleeding non-thrombosed external and internal                            hemorrhoids. Recommendation:           - The patient will be observed post-procedure,                            until all discharge criteria are met.                           - Discharge patient to home.                           - Patient has a contact number available for                            emergencies. The signs and symptoms of potential                            delayed complications were discussed with the  patient. Return to normal activities tomorrow.                            Written discharge instructions were provided to the                            patient.                           - High fiber diet.                            - Use FiberCon 1-2 tablets PO daily.                           - Continue present medications.                           - Await pathology results.                           - Repeat colonoscopy in 5/7 years for surveillance                            based on pathology results. Next colonoscopy should                            be with 1 week of Miralax daily prior to procedure                            and then preparation.                           - The findings and recommendations were discussed                            with the patient. Justice Britain, MD 01/07/2020 9:49:34 AM

## 2020-01-07 NOTE — Progress Notes (Signed)
Called to room to assist during endoscopic procedure.  Patient ID and intended procedure confirmed with present staff. Received instructions for my participation in the procedure from the performing physician.  

## 2020-01-11 ENCOUNTER — Telehealth: Payer: Self-pay | Admitting: *Deleted

## 2020-01-11 ENCOUNTER — Telehealth: Payer: Self-pay

## 2020-01-11 NOTE — Telephone Encounter (Signed)
  Follow up Call-  Call back number 01/07/2020  Post procedure Call Back phone  # [phone]  Permission to leave phone message Yes  Some recent data might be hidden     Patient questions:  Do you have a fever, pain , or abdominal swelling? No. Pain Score  0 *  Have you tolerated food without any problems? Yes.    Have you been able to return to your normal activities? Yes.    Do you have any questions about your discharge instructions: Diet   No. Medications  No. Follow up visit  No.  Do you have questions or concerns about your Care? No.  Actions: * If pain score is 4 or above: No action needed, pain <4.   1. Have you developed a fever since your procedure? no  2.   Have you had an respiratory symptoms (SOB or cough) since your procedure? no  3.   Have you tested positive for COVID 19 since your procedure no  4.   Have you had any family members/close contacts diagnosed with the COVID 19 since your procedure?  no   If yes to any of these questions please route to Joylene John, RN and Joella Prince, RN

## 2020-01-11 NOTE — Telephone Encounter (Signed)
1st follow up call made.  NALM 

## 2020-01-12 ENCOUNTER — Encounter: Payer: Self-pay | Admitting: Gastroenterology

## 2020-01-14 DIAGNOSIS — E785 Hyperlipidemia, unspecified: Secondary | ICD-10-CM | POA: Diagnosis not present

## 2020-01-14 DIAGNOSIS — Z125 Encounter for screening for malignant neoplasm of prostate: Secondary | ICD-10-CM | POA: Diagnosis not present

## 2020-01-19 DIAGNOSIS — R001 Bradycardia, unspecified: Secondary | ICD-10-CM | POA: Diagnosis not present

## 2020-01-19 DIAGNOSIS — I10 Essential (primary) hypertension: Secondary | ICD-10-CM | POA: Diagnosis not present

## 2020-01-19 DIAGNOSIS — N400 Enlarged prostate without lower urinary tract symptoms: Secondary | ICD-10-CM | POA: Diagnosis not present

## 2020-01-19 DIAGNOSIS — E785 Hyperlipidemia, unspecified: Secondary | ICD-10-CM | POA: Diagnosis not present

## 2020-01-19 DIAGNOSIS — Z87442 Personal history of urinary calculi: Secondary | ICD-10-CM | POA: Diagnosis not present

## 2020-01-19 DIAGNOSIS — R82998 Other abnormal findings in urine: Secondary | ICD-10-CM | POA: Diagnosis not present

## 2020-01-19 DIAGNOSIS — R0600 Dyspnea, unspecified: Secondary | ICD-10-CM | POA: Diagnosis not present

## 2020-01-19 DIAGNOSIS — R252 Cramp and spasm: Secondary | ICD-10-CM | POA: Diagnosis not present

## 2020-01-19 DIAGNOSIS — Z1212 Encounter for screening for malignant neoplasm of rectum: Secondary | ICD-10-CM | POA: Diagnosis not present

## 2020-01-19 DIAGNOSIS — Z0000 Encounter for general adult medical examination without abnormal findings: Secondary | ICD-10-CM | POA: Diagnosis not present

## 2020-01-19 DIAGNOSIS — I700 Atherosclerosis of aorta: Secondary | ICD-10-CM | POA: Diagnosis not present

## 2020-02-27 DIAGNOSIS — Z23 Encounter for immunization: Secondary | ICD-10-CM | POA: Diagnosis not present

## 2020-03-11 DIAGNOSIS — H01131 Eczematous dermatitis of right upper eyelid: Secondary | ICD-10-CM | POA: Diagnosis not present

## 2020-04-11 DIAGNOSIS — L309 Dermatitis, unspecified: Secondary | ICD-10-CM | POA: Diagnosis not present

## 2020-04-22 DIAGNOSIS — L239 Allergic contact dermatitis, unspecified cause: Secondary | ICD-10-CM | POA: Diagnosis not present

## 2020-05-12 DIAGNOSIS — L239 Allergic contact dermatitis, unspecified cause: Secondary | ICD-10-CM | POA: Diagnosis not present

## 2020-10-12 DIAGNOSIS — M25552 Pain in left hip: Secondary | ICD-10-CM | POA: Diagnosis not present

## 2020-10-12 DIAGNOSIS — I10 Essential (primary) hypertension: Secondary | ICD-10-CM | POA: Diagnosis not present

## 2020-10-12 DIAGNOSIS — R5383 Other fatigue: Secondary | ICD-10-CM | POA: Diagnosis not present

## 2020-10-12 DIAGNOSIS — R109 Unspecified abdominal pain: Secondary | ICD-10-CM | POA: Diagnosis not present

## 2021-01-25 DIAGNOSIS — Z125 Encounter for screening for malignant neoplasm of prostate: Secondary | ICD-10-CM | POA: Diagnosis not present

## 2021-01-25 DIAGNOSIS — E785 Hyperlipidemia, unspecified: Secondary | ICD-10-CM | POA: Diagnosis not present

## 2021-02-10 DIAGNOSIS — Z1331 Encounter for screening for depression: Secondary | ICD-10-CM | POA: Diagnosis not present

## 2021-02-10 DIAGNOSIS — Z23 Encounter for immunization: Secondary | ICD-10-CM | POA: Diagnosis not present

## 2021-02-10 DIAGNOSIS — I10 Essential (primary) hypertension: Secondary | ICD-10-CM | POA: Diagnosis not present

## 2021-02-10 DIAGNOSIS — Z87442 Personal history of urinary calculi: Secondary | ICD-10-CM | POA: Diagnosis not present

## 2021-02-10 DIAGNOSIS — Z0000 Encounter for general adult medical examination without abnormal findings: Secondary | ICD-10-CM | POA: Diagnosis not present

## 2021-02-10 DIAGNOSIS — R7301 Impaired fasting glucose: Secondary | ICD-10-CM | POA: Diagnosis not present

## 2021-02-10 DIAGNOSIS — E785 Hyperlipidemia, unspecified: Secondary | ICD-10-CM | POA: Diagnosis not present

## 2021-02-10 DIAGNOSIS — R001 Bradycardia, unspecified: Secondary | ICD-10-CM | POA: Diagnosis not present

## 2021-02-10 DIAGNOSIS — F39 Unspecified mood [affective] disorder: Secondary | ICD-10-CM | POA: Diagnosis not present

## 2021-02-10 DIAGNOSIS — I700 Atherosclerosis of aorta: Secondary | ICD-10-CM | POA: Diagnosis not present

## 2021-02-10 DIAGNOSIS — Z1389 Encounter for screening for other disorder: Secondary | ICD-10-CM | POA: Diagnosis not present

## 2021-02-10 DIAGNOSIS — N400 Enlarged prostate without lower urinary tract symptoms: Secondary | ICD-10-CM | POA: Diagnosis not present

## 2021-03-06 DIAGNOSIS — R0981 Nasal congestion: Secondary | ICD-10-CM | POA: Diagnosis not present

## 2021-03-06 DIAGNOSIS — R051 Acute cough: Secondary | ICD-10-CM | POA: Diagnosis not present

## 2021-03-06 DIAGNOSIS — R5383 Other fatigue: Secondary | ICD-10-CM | POA: Diagnosis not present

## 2021-03-06 DIAGNOSIS — Z1152 Encounter for screening for COVID-19: Secondary | ICD-10-CM | POA: Diagnosis not present

## 2022-04-30 ENCOUNTER — Ambulatory Visit: Payer: PPO | Attending: Internal Medicine | Admitting: Internal Medicine

## 2022-04-30 ENCOUNTER — Encounter: Payer: Self-pay | Admitting: Internal Medicine

## 2022-04-30 VITALS — BP 118/80 | HR 48 | Ht 69.0 in | Wt 215.2 lb

## 2022-04-30 DIAGNOSIS — I200 Unstable angina: Secondary | ICD-10-CM

## 2022-04-30 DIAGNOSIS — E785 Hyperlipidemia, unspecified: Secondary | ICD-10-CM | POA: Diagnosis not present

## 2022-04-30 DIAGNOSIS — Z79899 Other long term (current) drug therapy: Secondary | ICD-10-CM

## 2022-04-30 MED ORDER — METOPROLOL TARTRATE 25 MG PO TABS: ORAL_TABLET | ORAL | 0 refills | Status: DC

## 2022-04-30 MED ORDER — ROSUVASTATIN CALCIUM 20 MG PO TABS: 20.0000 mg | ORAL_TABLET | Freq: Every day | ORAL | 3 refills | Status: DC

## 2022-04-30 NOTE — Progress Notes (Signed)
Cardiology Office Note   Date:  05/01/2022   ID:  Vanson, Soldan 1945-12-01, MRN [id number]  PCP:  Cleatis Polka., MD  Cardiologist:   Dietrich Pates, MD   [place] referred for DOE      History of Present Illness: Michael Compton is a 76 y.o. male with a history of DOE   Pt started notdcing symtpoms about 1.5 years  Before that could do what he wanted to do without a problem   Now has slowed done      Going up stairs he gets SOB.   Occasionally gets dizzzy.    He denies CP or chest prssure    Takes activity at his own pace     Current Meds  Medication Sig   Coenzyme Q10 (COQ10 PO) Take by mouth.   glucosamine-chondroitin 500-400 MG tablet Take 2 tablets by mouth daily.    hydrochlorothiazide (HYDRODIURIL) 25 MG tablet Take 1 tablet (25 mg total) by mouth daily.   ibuprofen (ADVIL,MOTRIN) 200 MG tablet Take 200 mg by mouth every 6 (six) hours as needed.   Krill Oil 300 MG CAPS Take by mouth.   Lysine 1000 MG TABS Take 1,000 mg by mouth daily.   MAGNESIUM PO Take by mouth.   metoprolol tartrate (LOPRESSOR) 25 MG tablet Take one tablet with you to your cardiac CT.   omeprazole (PRILOSEC) 20 MG capsule Take 1 capsule (20 mg total) by mouth daily.   rosuvastatin (CRESTOR) 20 MG tablet Take 1 tablet (20 mg total) by mouth daily.   sildenafil (REVATIO) 20 MG tablet 1-2 tablets 2-3 hours prior to sex   tamsulosin (FLOMAX) 0.4 MG CAPS capsule Take 1 capsule (0.4 mg total) by mouth daily.   Testosterone 1.62 % GEL 2 shots daily, one shot each arm   tiZANidine (ZANAFLEX) 4 MG capsule Take 4 mg by mouth every 6 (six) hours as needed for muscle spasms.   [DISCONTINUED] rosuvastatin (CRESTOR) 10 MG tablet Take 10 mg by mouth at bedtime.     Allergies:   Simvastatin   Past Medical History:  Diagnosis Date   Bradycardia    DISC DISEASE, CERVICAL 11/15/2008   ERECTILE DYSFUNCTION 11/14/2007   GERD (gastroesophageal reflux disease)    on meds   HYPERLIPIDEMIA 11/14/2007    Hypertension    Nephrolithiasis     Past Surgical History:  Procedure Laterality Date   BELPHAROPTOSIS REPAIR     COLONOSCOPY  2016   TAx1   COLONOSCOPY  2005   SSP   CYSTOSCOPY/URETEROSCOPY/HOLMIUM LASER/STENT PLACEMENT Left 04/29/2018   Procedure: CYSTOSCOPY/LEFT URETEROSCOPY/HOLMIUM LASER/STENT PLACEMENT;  Surgeon: Crista Elliot, MD;  Location: [facility];  Service: Urology;  Laterality: Left;   FINGER SURGERY     HERNIA REPAIR     KNEE ARTHROSCOPY     POLYPECTOMY  2016   TA x 1     Social History:  The patient  reports that he has been smoking cigars. He has never used smokeless tobacco. He reports current alcohol use of about 7.0 standard drinks of alcohol per week. He reports that he does not use drugs.   Family History:  The patient's family history includes Bladder Cancer (age of onset: 58) in his father; Heart failure in his mother; Stomach cancer (age of onset: 109) in his father.    ROS:  Please see the history of present illness. All other systems are reviewed and  Negative to the above problem except as noted.  PHYSICAL EXAM: VS:  BP 118/80   Pulse (!) 48   Ht 5\' 9"  (1.753 m)   Wt 215 lb 3.2 oz (97.6 kg)   SpO2 98%   BMI 31.78 kg/m   GEN: Well nourished, well developed, in no acute distress  HEENT: normal  Neck: no JVD, carotid bruits Cardiac: RRR; no murmurs,  No LE  edema  Respiratory:  clear to auscultation bilaterally, GI: soft, nontender, nondistended, + BS  No hepatomegaly  MS: no deformity Moving all extremities   Skin: warm and dry, no rash Neuro:  Strength and sensation are intact Psych: euthymic mood, full affect   EKG:  EKG shows ectopic atrial rhythm  48 bpm  LVH      Lipid Panel    Component Value Date/Time   CHOL 175 05/06/2017 1113   TRIG 138.0 05/06/2017 1113   HDL 42.20 05/06/2017 1113   CHOLHDL 4 05/06/2017 1113   VLDL 27.6 05/06/2017 1113   LDLCALC 105 (H) 05/06/2017 1113   LDLDIRECT 153.9 02/17/2013 1153      Wt  Readings from Last 3 Encounters:  04/30/22 215 lb 3.2 oz (97.6 kg)  01/07/20 215 lb (97.5 kg)  12/24/19 215 lb (97.5 kg)      ASSESSMENT AND PLAN:  1  Dyspnea on exertion   COncering   Pt with CAD on CT scan     Concern that symptoms may represent angina. I would recmm CT coronary angiogram to evaluate coronary anatomy     2  HL  [place] CAD on CT scan I would recomm increasing Crestor to 20 mg  LDL was 99 HDL was 52 in Oct 2023     Follow up based on test results   Current medicines are reviewed at length with the patient today.  The patient does not have concerns regarding medicines.  Signed, Dietrich Pates, MD  05/01/2022 10:18 PM    [facility] [address] [place], [place], [place]  [address] Phone: [phone]; Fax: [phone]

## 2022-04-30 NOTE — Patient Instructions (Addendum)
Medication Instructions:  Increase Crestor to 20 mg daily   *If you need a refill on your cardiac medications before your next appointment, please call your pharmacy*   Lab Work: Bmet today  Nmr , apo b, lipo a in 8 weeks fasting   If you have labs (blood work) drawn today and your tests are completely normal, you will receive your results only by: [place] (if you have MyChart) OR A paper copy in the mail If you have any lab test that is abnormal or we need to change your treatment, we will call you to review the results.   Testing/Procedures:   Your cardiac CT will be scheduled at one of the below locations:   [facility] [address] [place], [place] [address] ([phone]) [place] [address] [place], [place] [address] [phone]  [facility] [place], [place] [address] [phone]  If scheduled at [facility], please arrive at the [facility] and Children's Entrance (Entrance C2) of [facility] 30 minutes prior to test start time. You can use the FREE valet parking offered at entrance C (encouraged to control the heart rate for the test)  Proceed to the [facility] Radiology Department (first floor) to check-in and test prep.  All radiology patients and guests should use entrance C2 at [facility], accessed from [facility], even though the hospital's physical address listed is [address]    If scheduled at [facility] or [facility], please arrive 15 mins early for check-in and test prep.   Please follow these instructions carefully (unless otherwise directed):  Hold all erectile dysfunction medications at least 3 days (72 hrs) prior to test. (Ie viagra, cialis, sildenafil, tadalafil, etc) We will administer nitroglycerin during this exam.    On the Night Before the Test: Be sure to Drink plenty of water. Do not consume any caffeinated/decaffeinated beverages or chocolate 12 hours prior to your test. Do not take any antihistamines 12 hours prior to your test.  On the Day of the Test: Drink plenty of water until 1 hour prior to the test. Do not eat any food 1 hour prior to test. You may take your regular medications prior to the test.  Take metoprolol (Lopressor) with you to the test.  HOLD Hydrochlorothiazide morning of the test.   After the Test: Drink plenty of water. After receiving IV contrast, you may experience a mild flushed feeling. This is normal. On occasion, you may experience a mild rash up to 24 hours after the test. This is not dangerous. If this occurs, you can take Benadryl 25 mg and increase your fluid intake. If you experience trouble breathing, this can be serious. If it is severe call 911 IMMEDIATELY. If it is mild, please call our office. If you take any of these medications: Glipizide/Metformin, Avandament, Glucavance, please do not take 48 hours after completing test unless otherwise instructed.  We will call to schedule your test 2-4 weeks out understanding that some insurance companies will need an authorization prior to the service being performed.   For non-scheduling related questions, please contact the cardiac imaging nurse navigator should you have any questions/concerns: Marchia Bond, Cardiac Imaging Nurse Navigator Gordy Clement, Cardiac Imaging Nurse Navigator [facility] Direct Office Dial: [phone]   For scheduling needs, including cancellations and rescheduling,  please call [place], [phone].    Follow-Up: At [facility], you and your health needs are our priority.  As part of our continuing mission to provide you with exceptional heart care, we have created designated Provider Care Teams.  These Care Teams include your primary  Cardiologist (physician) and Advanced Practice Providers (APPs -  Physician Assistants and Nurse Practitioners) who all work together to provide you with the care you need, when you need it.  We recommend signing up for the patient portal called "MyChart".  Sign up information is provided on this After Visit Summary.  MyChart is used to connect with patients for Virtual Visits (Telemedicine).  Patients are able to view lab/test results, encounter notes, upcoming appointments, etc.  Non-urgent messages can be sent to your provider as well.   To learn more about what you can do with MyChart, go to [email or web address].    Your next appointment:   8 week(s)  The format for your next appointment:   In Person  Provider:   Dr Dorris Carnes     Other [place]

## 2022-05-01 LAB — HEPATIC FUNCTION PANEL
ALT: 27 IU/L (ref 0–44)
AST: 31 IU/L (ref 0–40)
Albumin: 4 g/dL (ref 3.8–4.8)
Alkaline Phosphatase: 76 IU/L (ref 44–121)
Bilirubin Total: 0.5 mg/dL (ref 0.0–1.2)
Bilirubin, Direct: 0.15 mg/dL (ref 0.00–0.40)
Total Protein: 6.6 g/dL (ref 6.0–8.5)

## 2022-05-01 LAB — BASIC METABOLIC PANEL
BUN/Creatinine Ratio: 14 (ref 10–24)
BUN: 16 mg/dL (ref 8–27)
CO2: 23 mmol/L (ref 20–29)
Calcium: 8.9 mg/dL (ref 8.6–10.2)
Chloride: 101 mmol/L (ref 96–106)
Creatinine, Ser: 1.12 mg/dL (ref 0.76–1.27)
Glucose: 100 mg/dL — ABNORMAL HIGH (ref 70–99)
Potassium: 3.9 mmol/L (ref 3.5–5.2)
Sodium: 139 mmol/L (ref 134–144)
eGFR: 69 mL/min/{1.73_m2} (ref >59)

## 2022-05-07 ENCOUNTER — Telehealth (HOSPITAL_COMMUNITY): Payer: Self-pay | Admitting: Emergency Medicine

## 2022-05-07 NOTE — Telephone Encounter (Signed)
Reaching out to patient to offer assistance regarding upcoming cardiac imaging study; pt verbalizes understanding of appt date/time, parking situation and where to check in, pre-test NPO status and medications ordered, and verified current allergies; name and call back number provided for further questions should they arise Marchia Bond RN Navigator Cardiac Imaging [facility] [phone] office [phone] cell  Arrival [place] since HR 48 Aware to avoid HCTZ, sildenafil Aware contrast/nitro

## 2022-05-09 ENCOUNTER — Ambulatory Visit (HOSPITAL_COMMUNITY)
Admission: RE | Admit: 2022-05-09 | Discharge: 2022-05-09 | Disposition: A | Discharge status: Home / Self Care | Payer: PPO | Source: Ambulatory Visit | Attending: Internal Medicine | Admitting: Internal Medicine

## 2022-05-09 ENCOUNTER — Other Ambulatory Visit: Payer: Self-pay | Admitting: Cardiology

## 2022-05-09 DIAGNOSIS — I200 Unstable angina: Secondary | ICD-10-CM | POA: Insufficient documentation

## 2022-05-09 DIAGNOSIS — I25110 Atherosclerotic heart disease of native coronary artery with unstable angina pectoris: Secondary | ICD-10-CM | POA: Diagnosis not present

## 2022-05-09 DIAGNOSIS — Z79899 Other long term (current) drug therapy: Secondary | ICD-10-CM | POA: Insufficient documentation

## 2022-05-09 DIAGNOSIS — R931 Abnormal findings on diagnostic imaging of heart and coronary circulation: Secondary | ICD-10-CM | POA: Insufficient documentation

## 2022-05-09 DIAGNOSIS — E785 Hyperlipidemia, unspecified: Secondary | ICD-10-CM | POA: Insufficient documentation

## 2022-05-09 MED ORDER — NITROGLYCERIN 0.4 MG SL SUBL
0.8000 mg | SUBLINGUAL_TABLET | Freq: Once | SUBLINGUAL | Status: AC
  Administered 2022-05-09: 0.8 mg via SUBLINGUAL

## 2022-05-09 MED ORDER — IOHEXOL 350 MG/ML SOLN
100.0000 mL | Freq: Once | INTRAVENOUS | Status: AC | PRN
  Administered 2022-05-09: 100 mL via INTRAVENOUS

## 2022-05-09 MED ORDER — NITROGLYCERIN 0.4 MG SL SUBL
SUBLINGUAL_TABLET | SUBLINGUAL | Status: AC
  Filled 2022-05-09: qty 2

## 2022-05-10 ENCOUNTER — Ambulatory Visit (HOSPITAL_BASED_OUTPATIENT_CLINIC_OR_DEPARTMENT_OTHER)
Admission: RE | Admit: 2022-05-10 | Discharge: 2022-05-10 | Disposition: A | Discharge status: Home / Self Care | Payer: PPO | Source: Ambulatory Visit | Attending: Cardiology | Admitting: Cardiology

## 2022-05-10 DIAGNOSIS — E785 Hyperlipidemia, unspecified: Secondary | ICD-10-CM | POA: Diagnosis not present

## 2022-05-10 DIAGNOSIS — R931 Abnormal findings on diagnostic imaging of heart and coronary circulation: Secondary | ICD-10-CM

## 2022-07-02 ENCOUNTER — Ambulatory Visit: Payer: Medicare HMO | Attending: Internal Medicine

## 2022-07-02 DIAGNOSIS — E785 Hyperlipidemia, unspecified: Secondary | ICD-10-CM

## 2022-07-02 DIAGNOSIS — Z79899 Other long term (current) drug therapy: Secondary | ICD-10-CM | POA: Diagnosis not present

## 2022-07-02 DIAGNOSIS — I200 Unstable angina: Secondary | ICD-10-CM | POA: Diagnosis not present

## 2022-07-03 LAB — NMR, LIPOPROFILE
Cholesterol, Total: 148 mg/dL (ref 100–199)
HDL Particle Number: 36.3 umol/L (ref >=30.5)
HDL-C: 52 mg/dL (ref >39)
LDL Particle Number: 1077 nmol/L — ABNORMAL HIGH (ref <1000)
LDL Size: 20.3 nm — ABNORMAL LOW (ref >20.5)
LDL-C (NIH Calc): 83 mg/dL (ref 0–99)
LP-IR Score: 32 (ref <=45)
Small LDL Particle Number: 612 nmol/L — ABNORMAL HIGH (ref <=527)
Triglycerides: 67 mg/dL (ref 0–149)

## 2022-07-03 LAB — APOLIPOPROTEIN B: Apolipoprotein B: 81 mg/dL (ref <90)

## 2022-07-03 LAB — LIPOPROTEIN A (LPA): Lipoprotein (a): 18.4 nmol/L (ref <75.0)

## 2022-07-05 NOTE — Progress Notes (Signed)
Cardiology Office Note   Date:  07/06/2022   ID:  Daxx, Martineau [birth year], MRN [id number]  PCP:  Ginger Organ., MD  Cardiologist:   Dorris Carnes, MD   Patien referred for DOE      History of Present Illness: Michael Compton is a 77 y.o. male with a history of DOE   Pt started notdcing symtpoms about 1.5 years  Before that could do what he wanted to do without a problem   Now has slowed done      Going up stairs he gets SOB.   Occasionally gets dizzzy.    He denies CP or chest prssure    Takes activity at his own pace    I saw the pt in Dec 2023   I recomm a CT coronary angiogram  Pt has some dizziness while in bed, not with standing   He says he will get it a couple times per week   Denies CP   Breathing is OK         Current Meds  Medication Sig   Coenzyme Q10 (COQ10 PO) Take by mouth.   hydrochlorothiazide (HYDRODIURIL) 25 MG tablet Take 1 tablet (25 mg total) by mouth daily.   ibuprofen (ADVIL,MOTRIN) 200 MG tablet Take 200 mg by mouth every 6 (six) hours as needed.   Krill Oil 300 MG CAPS Take 300 mg by mouth daily.   Lysine 1000 MG TABS Take 1,000 mg by mouth daily.   MAGNESIUM PO Take by mouth.   omeprazole (PRILOSEC) 20 MG capsule Take 1 capsule (20 mg total) by mouth daily.   rosuvastatin (CRESTOR) 20 MG tablet Take 1 tablet (20 mg total) by mouth daily.   sildenafil (REVATIO) 20 MG tablet 1-2 tablets 2-3 hours prior to sex   tamsulosin (FLOMAX) 0.4 MG CAPS capsule Take 1 capsule (0.4 mg total) by mouth daily.   Testosterone 1.62 % GEL 2 shots daily, one shot each arm   tiZANidine (ZANAFLEX) 4 MG capsule Take 4 mg by mouth every 6 (six) hours as needed for muscle spasms.   TURMERIC CURCUMIN PO Take by mouth daily.     Allergies:   Simvastatin   Past Medical History:  Diagnosis Date   Bradycardia    DISC DISEASE, CERVICAL 11/15/2008   ERECTILE DYSFUNCTION 11/14/2007   GERD (gastroesophageal reflux disease)    on meds   HYPERLIPIDEMIA 11/14/2007    Hypertension    Nephrolithiasis     Past Surgical History:  Procedure Laterality Date   BELPHAROPTOSIS REPAIR     COLONOSCOPY  2016   TAx1   COLONOSCOPY  2005   SSP   CYSTOSCOPY/URETEROSCOPY/HOLMIUM LASER/STENT PLACEMENT Left 04/29/2018   Procedure: CYSTOSCOPY/LEFT URETEROSCOPY/HOLMIUM LASER/STENT PLACEMENT;  Surgeon: Lucas Mallow, MD;  Location: [facility];  Service: Urology;  Laterality: Left;   FINGER SURGERY     HERNIA REPAIR     KNEE ARTHROSCOPY     POLYPECTOMY  2016   TA x 1     Social History:  The patient  reports that he has been smoking cigars. He has never used smokeless tobacco. He reports current alcohol use of about 7.0 standard drinks of alcohol per week. He reports that he does not use drugs.   Family History:  The patient's family history includes Bladder Cancer (age of onset: 79) in his father; Heart failure in his mother; Stomach cancer (age of onset: 48) in his father.    ROS:  Please  see the history of present illness. All other systems are reviewed and  Negative to the above problem except as noted.    PHYSICAL EXAM: VS:  BP 138/72   Pulse (!) 48   Ht 5' 9"$  (1.753 m)   Wt 214 lb (97.1 kg)   BMI 31.60 kg/m   GEN: Well nourished, well developed, in no acute distress  HEENT: normal  Neck: no JVD, carotid bruits Cardiac: RRR; no murmurs,  No LE  edema  Respiratory:  clear to auscultation bilaterally, GI: soft, nontender, nondistended, + BS  No hepatomegaly  MS: no deformity Moving all extremities   Skin: warm and dry, no rash Neuro:  Strength and sensation are intact Psych: euthymic mood, full affect   EKG:  EKG shows ectopic atrial rhythm  48 bpm  LVH     CT FFR    1. Left Main: FFR = 0.99   2. LAD: Proximal FFR = 0.99, mid FFR = 0.86, distal FFR = 0.80 3. LCX: Proximal FFR = 0.99, distal FFR = not analyzed 4. RCA: Proximal FFR = 0.99, mid FFR =0.99, distal FFR = 0.90   IMPRESSION: 1.  CT FFR analysis showed no significant  stenosis. CT  05/09/22  Aorta: Normal size. There is mild aortic root calcification. No dissection.   Aortic Valve:  Trileaflet.  No calcifications.   Coronary Arteries: Normal coronary origin. Right dominance. RCA is a large dominant artery that gives rise to PDA and PLA. There is a misregistration artifact that make visibility poor in the RCA. However there diffuse mild calcification throughout the vessel.   Left main is a large artery that gives rise to LAD and LCX arteries.   LAD is a large vessel. The proximal LAD with diffuse mild (25-49%) calcified plaques. The mid LAD for have a moderate (50-69%) sized soft plaque. In the mid to distal LAD is a focal calcified plaque. Minimal calcification the distal LAD.   LCX is a non-dominant artery that gives rise to one large OM1 branch. There is no plaque.   Coronary Calcium Score:   Left main: 0   Left anterior descending artery: 191   Left circumflex artery: 0   Right coronary artery: 1283   Total: 1475   Percentile: 90   Other findings:   Normal pulmonary vein drainage into the left atrium.   Normal left atrial appendage without a thrombus.   Normal size of the pulmonary artery.   IMPRESSION: 1. Coronary calcium score of 1475. This was 90 percentile for age and sex matched control.   2. Normal coronary origin with right dominance.   3. CAD-RADS 3. Moderate stenosis. Consider symptom-guided anti-ischemic pharmacotherapy as well as risk factor modification per guideline directed care. Additional analysis with CT FFR will be submitted.   Lipid Panel    Component Value Date/Time   CHOL 175 05/06/2017 1113   TRIG 138.0 05/06/2017 1113   HDL 42.20 05/06/2017 1113   CHOLHDL 4 05/06/2017 1113   VLDL 27.6 05/06/2017 1113   LDLCALC 105 (H) 05/06/2017 1113   LDLDIRECT 153.9 02/17/2013 1153      Wt Readings from Last 3 Encounters:  07/06/22 214 lb (97.1 kg)  04/30/22 215 lb 3.2 oz (97.6 kg)  01/07/20 215 lb  (97.5 kg)      ASSESSMENT AND PLAN:  1  Dyspnea on exertion    Patient is still having some though he thinks it is getting better    CT scan shows no flow limiting  dz     ? Microvascular dz     ? Bradycardia     He had monitor at [facility]   I asked him to send it  I 'll review (done 1 year ago, when not symptomatic)  Consider repeat  since symptpmatic    Or consider GXT   Consider echo   2  CAD   CT as noted   Risk factor modify  3   Dizziness   ? Due to bradycardia  ? Due to meds (flomax, poss Revatio)    Follow    May get monitor    4  Blood pressure   Discussed goals 110s / 120s   Follow at home and forward results     5  HL  LDL 83  Adding Zetia to Crestor 20      Follow up in July or sooner if worsening symptoms     Current medicines are reviewed at length with the patient today.  The patient does not have concerns regarding medicines.  Signed, Dorris Carnes, MD  07/06/2022 8:54 AM    [facility] [address], [place], [place]  [address] Phone: [phone]; Fax: [phone]

## 2022-07-06 ENCOUNTER — Encounter: Payer: Self-pay | Admitting: Internal Medicine

## 2022-07-06 ENCOUNTER — Ambulatory Visit: Payer: Medicare HMO | Attending: Internal Medicine | Admitting: Internal Medicine

## 2022-07-06 VITALS — BP 138/72 | HR 48 | Ht 69.0 in | Wt 214.0 lb

## 2022-07-06 DIAGNOSIS — E785 Hyperlipidemia, unspecified: Secondary | ICD-10-CM | POA: Diagnosis not present

## 2022-07-06 DIAGNOSIS — Z79899 Other long term (current) drug therapy: Secondary | ICD-10-CM

## 2022-07-06 DIAGNOSIS — R931 Abnormal findings on diagnostic imaging of heart and coronary circulation: Secondary | ICD-10-CM

## 2022-07-06 DIAGNOSIS — R0609 Other forms of dyspnea: Secondary | ICD-10-CM

## 2022-07-06 MED ORDER — EZETIMIBE 10 MG PO TABS: 10.0000 mg | ORAL_TABLET | Freq: Every day | ORAL | 3 refills | Status: DC

## 2022-07-06 NOTE — Patient Instructions (Signed)
Medication Instructions:  Start Zetia 10 mg a day  *If you need a refill on your cardiac medications before your next appointment, please call your pharmacy*   Lab Work: Nmr and hepatic in 8 weeks  If you have labs (blood work) drawn today and your tests are completely normal, you will receive your results only by: [place] (if you have MyChart) OR A paper copy in the mail If you have any lab test that is abnormal or we need to change your treatment, we will call you to review the results.   Testing/Procedures:    Follow-Up: At [facility], you and your health needs are our priority.  As part of our continuing mission to provide you with exceptional heart care, we have created designated Provider Care Teams.  These Care Teams include your primary Cardiologist (physician) and Advanced Practice Providers (APPs -  Physician Assistants and Nurse Practitioners) who all work together to provide you with the care you need, when you need it.  We recommend signing up for the patient portal called "MyChart".  Sign up information is provided on this After Visit Summary.  MyChart is used to connect with patients for Virtual Visits (Telemedicine).  Patients are able to view lab/test results, encounter notes, upcoming appointments, etc.  Non-urgent messages can be sent to your provider as well.   To learn more about what you can do with MyChart, go to [email or web address].    Your next appointment:   6 month(s)  Dr Dorris Carnes     Other Instructions

## 2022-07-07 NOTE — Telephone Encounter (Signed)
Reviewed monitor   NO severe bradycardia I would recomm exercise treadmill test    I am not sure if it should be bruce protocol or modified bruce    Assess on day of test

## 2022-07-18 ENCOUNTER — Telehealth: Payer: Self-pay | Admitting: Internal Medicine

## 2022-07-18 ENCOUNTER — Ambulatory Visit: Payer: Medicare HMO | Attending: Internal Medicine

## 2022-07-18 DIAGNOSIS — R0609 Other forms of dyspnea: Secondary | ICD-10-CM

## 2022-07-18 DIAGNOSIS — R931 Abnormal findings on diagnostic imaging of heart and coronary circulation: Secondary | ICD-10-CM | POA: Diagnosis not present

## 2022-07-18 DIAGNOSIS — Z79899 Other long term (current) drug therapy: Secondary | ICD-10-CM | POA: Diagnosis not present

## 2022-07-18 LAB — EXERCISE TOLERANCE TEST
Angina Index: 0
Duke Treadmill Score: 4
Estimated workload: 5.8
Exercise duration (min): 4 min
Exercise duration (sec): 2 s
MPHR: 144 {beats}/min
Peak HR: 134 {beats}/min
Percent HR: 93 %
RPE: 16
Rest HR: 49 {beats}/min
ST Depression (mm): 0 mm

## 2022-07-18 NOTE — Telephone Encounter (Signed)
Spoke to patient re results   He felt good on treadmill   Could have gone longer  I told him probably stopped due to high BP   Hypertensive response HR did go up to 144  Able to increase HR appropriately  I would recomm a trial of amlodipine 2.5 mg in addition to HCTZ 25 mg that he is taking The pt should be due for lipids in 7 wks with liver panel    Probably with change in meds to 25 HCTZ he should get a BMET and MG in interval  He says he will start working out more   I told him to use my chart to communicate with how feeling

## 2022-07-19 MED ORDER — AMLODIPINE BESYLATE 2.5 MG PO TABS: 2.5000 mg | ORAL_TABLET | Freq: Every day | ORAL | 3 refills | Status: DC

## 2022-07-19 NOTE — Telephone Encounter (Signed)
Spoke with the pt and Amlodipine sent in and med list updated. Pt is having labs 08/31/22.

## 2022-07-19 NOTE — Addendum Note (Signed)
Addended by: Stephani Police on: 07/19/2022 10:04 AM   Modules accepted: Orders

## 2022-08-31 ENCOUNTER — Ambulatory Visit: Payer: Medicare HMO | Attending: Internal Medicine

## 2022-08-31 DIAGNOSIS — R0609 Other forms of dyspnea: Secondary | ICD-10-CM | POA: Diagnosis not present

## 2022-08-31 DIAGNOSIS — E785 Hyperlipidemia, unspecified: Secondary | ICD-10-CM

## 2022-08-31 DIAGNOSIS — Z79899 Other long term (current) drug therapy: Secondary | ICD-10-CM

## 2022-09-05 ENCOUNTER — Telehealth: Payer: Self-pay

## 2022-09-05 NOTE — Telephone Encounter (Signed)
-----   Message from Pricilla Riffle, MD sent at 09/03/2022 10:38 PM EDT ----- See accompanying note

## 2022-09-05 NOTE — Telephone Encounter (Signed)
LDL is 68.    Better   Given severity of coronary calcifications. I would use guidelines for tighter control   55 Set up for Incliseran infusions. Follow up lipomed and liver panel in 12 wks   I spoke with the pt and went over Dr Tenny Craw' recommendations and the pt was on the [place]... very reluctant and asked if he could talk with Dr Tenny Craw about her plans... I advised him that I will send her message to see if she can call him over the next few days and he agreed.

## 2022-09-07 LAB — HEPATIC FUNCTION PANEL
ALT: 30 IU/L (ref 0–44)
AST: 36 IU/L (ref 0–40)
Albumin: 4.3 g/dL (ref 3.8–4.8)
Alkaline Phosphatase: 69 IU/L (ref 44–121)
Bilirubin Total: 0.7 mg/dL (ref 0.0–1.2)
Bilirubin, Direct: 0.22 mg/dL (ref 0.00–0.40)
Total Protein: 6.8 g/dL (ref 6.0–8.5)

## 2022-09-07 LAB — NMR, LIPOPROFILE
Cholesterol, Total: 138 mg/dL (ref 100–199)
HDL Particle Number: 37.8 umol/L (ref >=30.5)
HDL-C: 59 mg/dL (ref >39)
LDL Particle Number: 1028 nmol/L — ABNORMAL HIGH (ref <1000)
LDL Size: 20 nm — ABNORMAL LOW (ref >20.5)
LDL-C (NIH Calc): 68 mg/dL (ref 0–99)
LP-IR Score: <25 (ref <=45)
Small LDL Particle Number: 746 nmol/L — ABNORMAL HIGH (ref <=527)
Triglycerides: 51 mg/dL (ref 0–149)

## 2022-09-07 LAB — BASIC METABOLIC PANEL
BUN/Creatinine Ratio: 15 (ref 10–24)
BUN: 19 mg/dL (ref 8–27)
CO2: 24 mmol/L (ref 20–29)
Calcium: 9 mg/dL (ref 8.6–10.2)
Chloride: 103 mmol/L (ref 96–106)
Creatinine, Ser: 1.31 mg/dL — ABNORMAL HIGH (ref 0.76–1.27)
Glucose: 105 mg/dL — ABNORMAL HIGH (ref 70–99)
Potassium: 4.7 mmol/L (ref 3.5–5.2)
Sodium: 141 mmol/L (ref 134–144)
eGFR: 56 mL/min/{1.73_m2} — ABNORMAL LOW (ref >59)

## 2022-09-07 LAB — MAGNESIUM: Magnesium: 2.3 mg/dL (ref 1.6–2.3)

## 2022-10-24 DIAGNOSIS — Z0100 Encounter for examination of eyes and vision without abnormal findings: Secondary | ICD-10-CM | POA: Diagnosis not present

## 2022-10-24 DIAGNOSIS — H5203 Hypermetropia, bilateral: Secondary | ICD-10-CM | POA: Diagnosis not present

## 2022-10-29 DIAGNOSIS — D2261 Melanocytic nevi of right upper limb, including shoulder: Secondary | ICD-10-CM | POA: Diagnosis not present

## 2022-10-29 DIAGNOSIS — L821 Other seborrheic keratosis: Secondary | ICD-10-CM | POA: Diagnosis not present

## 2022-10-29 DIAGNOSIS — D485 Neoplasm of uncertain behavior of skin: Secondary | ICD-10-CM | POA: Diagnosis not present

## 2022-10-29 DIAGNOSIS — L570 Actinic keratosis: Secondary | ICD-10-CM | POA: Diagnosis not present

## 2022-10-29 DIAGNOSIS — L218 Other seborrheic dermatitis: Secondary | ICD-10-CM | POA: Diagnosis not present

## 2022-10-29 DIAGNOSIS — L820 Inflamed seborrheic keratosis: Secondary | ICD-10-CM | POA: Diagnosis not present

## 2022-10-29 DIAGNOSIS — D0471 Carcinoma in situ of skin of right lower limb, including hip: Secondary | ICD-10-CM | POA: Diagnosis not present

## 2022-10-29 DIAGNOSIS — D2262 Melanocytic nevi of left upper limb, including shoulder: Secondary | ICD-10-CM | POA: Diagnosis not present

## 2022-10-29 DIAGNOSIS — D1801 Hemangioma of skin and subcutaneous tissue: Secondary | ICD-10-CM | POA: Diagnosis not present

## 2022-12-30 NOTE — Progress Notes (Unsigned)
Cardiology Office Note   Date:  01/04/2023   ID:  Michael, Compton 01/05/1946, MRN [id number]  PCP:  Cleatis Polka., MD  Cardiologist:   Dietrich Pates, MD   Pt presents for follow up of HTN and DOE     History of Present Illness: Michael Compton is a 77 y.o. male with a history of DOE   Pt started noticing symtpoms about  2022  I saw the pt in Dec 2023   CT coronary angiogram done THis showed:  Ca score was 1475 (191 LAD; 1283 RCA)     LAD with mod dz  SOme soft plaque in mid region of LAD noted     FFR was not significant in LAD, RCA    I saw the pt in clinic in Feb 2024   GXTordered.  He exercised 4 min    HR increased to 134 bpm  Had some PVCs during stress. NO significant ST changes  He did have a marked hypertensive response, with BP going to 209/115 With this I added amlodipoine 2.5 mg and increased hydrochlorothiazide to 25     Labs in April   LDL 68  particles 1028   Considered Inclisarin   No decision made  Kept on Crestor  Since seen he has felt great   Very active    Walking , goes to gym a few times per week   Denies CP  Breathing is OK         Current Meds  Medication Sig   amLODipine (NORVASC) 2.5 MG tablet Take 1 tablet (2.5 mg total) by mouth daily.   Coenzyme Q10 (COQ10 PO) Take by mouth.   ezetimibe (ZETIA) 10 MG tablet Take 1 tablet (10 mg total) by mouth daily.   hydrochlorothiazide (HYDRODIURIL) 25 MG tablet Take 1 tablet (25 mg total) by mouth daily.   ibuprofen (ADVIL,MOTRIN) 200 MG tablet Take 200 mg by mouth every 6 (six) hours as needed.   Krill Oil 300 MG CAPS Take 300 mg by mouth daily.   Lysine 1000 MG TABS Take 1,000 mg by mouth daily.   MAGNESIUM PO Take by mouth.   omeprazole (PRILOSEC) 20 MG capsule Take 1 capsule (20 mg total) by mouth daily.   rosuvastatin (CRESTOR) 20 MG tablet Take 1 tablet (20 mg total) by mouth daily.   sildenafil (REVATIO) 20 MG tablet 1-2 tablets 2-3 hours prior to sex   tamsulosin (FLOMAX) 0.4 MG CAPS capsule Take  1 capsule (0.4 mg total) by mouth daily.   Testosterone 1.62 % GEL 2 shots daily, one shot each arm   tiZANidine (ZANAFLEX) 4 MG capsule Take 4 mg by mouth every 6 (six) hours as needed for muscle spasms.   TURMERIC CURCUMIN PO Take by mouth daily.     Allergies:   Simvastatin   Past Medical History:  Diagnosis Date   Bradycardia    DISC DISEASE, CERVICAL 11/15/2008   ERECTILE DYSFUNCTION 11/14/2007   GERD (gastroesophageal reflux disease)    on meds   HYPERLIPIDEMIA 11/14/2007   Hypertension    Nephrolithiasis     Past Surgical History:  Procedure Laterality Date   BELPHAROPTOSIS REPAIR     COLONOSCOPY  2016   TAx1   COLONOSCOPY  2005   SSP   CYSTOSCOPY/URETEROSCOPY/HOLMIUM LASER/STENT PLACEMENT Left 04/29/2018   Procedure: CYSTOSCOPY/LEFT URETEROSCOPY/HOLMIUM LASER/STENT PLACEMENT;  Surgeon: Crista Elliot, MD;  Location: [facility];  Service: Urology;  Laterality: Left;   FINGER SURGERY  HERNIA REPAIR     KNEE ARTHROSCOPY     POLYPECTOMY  2016   TA x 1     Social History:  The patient  reports that he has been smoking cigars. He has never used smokeless tobacco. He reports current alcohol use of about 7.0 standard drinks of alcohol per week. He reports that he does not use drugs.   Family History:  The patient's family history includes Bladder Cancer (age of onset: 58) in his father; Heart failure in his mother; Stomach cancer (age of onset: 6) in his father.    ROS:  Please see the history of present illness. All other systems are reviewed and  Negative to the above problem except as noted.    PHYSICAL EXAM: VS:  BP 118/84   Pulse 60   Ht 5\' 9"  (1.753 m)   Wt 206 lb (93.4 kg)   BMI 30.42 kg/m   GEN: Well nourished, well developed, in no acute distress  HEENT: normal  Neck: no JVD,  Cardiac: RRR; no murmur  No LE  edema  Respiratory:  clear to auscultation bilaterally, GI: soft, nontender,  No masses No hepatomegaly  MS: no deformity Moving all  extremities   Skin: warm and dry, no rash  Tan   EKG:  EKG not done today CT FFR    1. Left Main: FFR = 0.99   2. LAD: Proximal FFR = 0.99, mid FFR = 0.86, distal FFR = 0.80 3. LCX: Proximal FFR = 0.99, distal FFR = not analyzed 4. RCA: Proximal FFR = 0.99, mid FFR =0.99, distal FFR = 0.90   IMPRESSION: 1.  CT FFR analysis showed no significant stenosis. CT  05/09/22  Aorta: Normal size. There is mild aortic root calcification. No dissection.   Aortic Valve:  Trileaflet.  No calcifications.   Coronary Arteries: Normal coronary origin. Right dominance. RCA is a large dominant artery that gives rise to PDA and PLA. There is a misregistration artifact that make visibility poor in the RCA. However there diffuse mild calcification throughout the vessel.   Left main is a large artery that gives rise to LAD and LCX arteries.   LAD is a large vessel. The proximal LAD with diffuse mild (25-49%) calcified plaques. The mid LAD for have a moderate (50-69%) sized soft plaque. In the mid to distal LAD is a focal calcified plaque. Minimal calcification the distal LAD.   LCX is a non-dominant artery that gives rise to one large OM1 branch. There is no plaque.   Coronary Calcium Score:   Left main: 0   Left anterior descending artery: 191   Left circumflex artery: 0   Right coronary artery: 1283   Total: 1475   Percentile: 90   Other findings:   Normal pulmonary vein drainage into the left atrium.   Normal left atrial appendage without a thrombus.   Normal size of the pulmonary artery.   IMPRESSION: 1. Coronary calcium score of 1475. This was 90 percentile for age and sex matched control.   2. Normal coronary origin with right dominance.   3. CAD-RADS 3. Moderate stenosis. Consider symptom-guided anti-ischemic pharmacotherapy as well as risk factor modification per guideline directed care. Additional analysis with CT FFR will be submitted.   Lipid Panel     Component Value Date/Time   CHOL 175 05/06/2017 1113   TRIG 138.0 05/06/2017 1113   HDL 42.20 05/06/2017 1113   CHOLHDL 4 05/06/2017 1113   VLDL 27.6 05/06/2017 1113  LDLCALC 105 (H) 05/06/2017 1113   LDLDIRECT 153.9 02/17/2013 1153      Wt Readings from Last 3 Encounters:  01/04/23 206 lb (93.4 kg)  07/06/22 214 lb (97.1 kg)  04/30/22 215 lb 3.2 oz (97.6 kg)      ASSESSMENT AND PLAN:  1  Dyspnea on exertion    Extensive evaluation  with CCTA, GXT   Pt with marked HTN on treadmil   After meds adjusted he is doing better   ? Due to microvascular dz and decreased perfusion when HTNsive   2  CAD   CT as noted.  No flow limiting dz by FFR   DOing well on current regimen  3   Dizziness    Hx of dizziness  Currently denies   Feels great    4  Blood pressure   BP at rest is controlled      Check BMET   5  HL  On Zetia and Crestor   Will recheck lipomed today   Also check uric acid    Current medicines are reviewed at length with the patient today.  The patient does not have concerns regarding medicines.  Signed, Dietrich Pates, MD  01/04/2023 8:41 AM    [facility] [address] [place], [place], [place]  [address] Phone: [phone]; Fax: [phone]

## 2023-01-04 ENCOUNTER — Ambulatory Visit: Payer: Medicare HMO | Attending: Internal Medicine | Admitting: Internal Medicine

## 2023-01-04 ENCOUNTER — Encounter: Payer: Self-pay | Admitting: Internal Medicine

## 2023-01-04 VITALS — BP 118/84 | HR 60 | Ht 69.0 in | Wt 206.0 lb

## 2023-01-04 DIAGNOSIS — E785 Hyperlipidemia, unspecified: Secondary | ICD-10-CM | POA: Diagnosis not present

## 2023-01-04 DIAGNOSIS — Z79899 Other long term (current) drug therapy: Secondary | ICD-10-CM

## 2023-01-04 NOTE — Patient Instructions (Signed)
Medication Instructions:   *If you need a refill on your cardiac medications before your next appointment, please call your pharmacy*   Lab Work: BMET, URIC ACID, NMR  If you have labs (blood work) drawn today and your tests are completely normal, you will receive your results only by: MyChart Message (if you have MyChart) OR A paper copy in the mail If you have any lab test that is abnormal or we need to change your treatment, we will call you to review the results.   Testing/Procedures:    Follow-Up: At [facility], you and your health needs are our priority.  As part of our continuing mission to provide you with exceptional heart care, we have created designated Provider Care Teams.  These Care Teams include your primary Cardiologist (physician) and Advanced Practice Providers (APPs -  Physician Assistants and Nurse Practitioners) who all work together to provide you with the care you need, when you need it.  We recommend signing up for the patient portal called "MyChart".  Sign up information is provided on this After Visit Summary.  MyChart is used to connect with patients for Virtual Visits (Telemedicine).  Patients are able to view lab/test results, encounter notes, upcoming appointments, etc.  Non-urgent messages can be sent to your provider as well.   To learn more about what you can do with MyChart, go to [email or web address].    DR Dietrich Pates  in 6 months      Other Instructions

## 2023-01-06 LAB — NMR, LIPOPROFILE
Cholesterol, Total: 128 mg/dL (ref 100–199)
HDL Particle Number: 34.7 umol/L (ref >=30.5)
HDL-C: 52 mg/dL (ref >39)
LDL Particle Number: 809 nmol/L (ref <1000)
LDL Size: 19.9 nm — ABNORMAL LOW (ref >20.5)
LDL-C (NIH Calc): 60 mg/dL (ref 0–99)
LP-IR Score: 37 (ref <=45)
Small LDL Particle Number: 606 nmol/L — ABNORMAL HIGH (ref <=527)
Triglycerides: 84 mg/dL (ref 0–149)

## 2023-01-06 LAB — BASIC METABOLIC PANEL
BUN/Creatinine Ratio: 15 (ref 10–24)
BUN: 16 mg/dL (ref 8–27)
CO2: 22 mmol/L (ref 20–29)
Calcium: 8.6 mg/dL (ref 8.6–10.2)
Chloride: 105 mmol/L (ref 96–106)
Creatinine, Ser: 1.06 mg/dL (ref 0.76–1.27)
Glucose: 107 mg/dL — ABNORMAL HIGH (ref 70–99)
Potassium: 4.4 mmol/L (ref 3.5–5.2)
Sodium: 139 mmol/L (ref 134–144)
eGFR: 73 mL/min/{1.73_m2} (ref >59)

## 2023-01-06 LAB — URIC ACID: Uric Acid: 5.6 mg/dL (ref 3.8–8.4)

## 2023-02-04 DIAGNOSIS — L738 Other specified follicular disorders: Secondary | ICD-10-CM | POA: Diagnosis not present

## 2023-02-04 DIAGNOSIS — Z85828 Personal history of other malignant neoplasm of skin: Secondary | ICD-10-CM | POA: Diagnosis not present

## 2023-02-04 DIAGNOSIS — L814 Other melanin hyperpigmentation: Secondary | ICD-10-CM | POA: Diagnosis not present

## 2023-02-04 DIAGNOSIS — L821 Other seborrheic keratosis: Secondary | ICD-10-CM | POA: Diagnosis not present

## 2023-04-01 DIAGNOSIS — R82998 Other abnormal findings in urine: Secondary | ICD-10-CM | POA: Diagnosis not present

## 2023-04-03 DIAGNOSIS — Z1212 Encounter for screening for malignant neoplasm of rectum: Secondary | ICD-10-CM | POA: Diagnosis not present

## 2023-04-08 DIAGNOSIS — I10 Essential (primary) hypertension: Secondary | ICD-10-CM | POA: Diagnosis not present

## 2023-04-08 DIAGNOSIS — R7301 Impaired fasting glucose: Secondary | ICD-10-CM | POA: Diagnosis not present

## 2023-04-08 DIAGNOSIS — N400 Enlarged prostate without lower urinary tract symptoms: Secondary | ICD-10-CM | POA: Diagnosis not present

## 2023-04-08 DIAGNOSIS — R001 Bradycardia, unspecified: Secondary | ICD-10-CM | POA: Diagnosis not present

## 2023-04-08 DIAGNOSIS — I2510 Atherosclerotic heart disease of native coronary artery without angina pectoris: Secondary | ICD-10-CM | POA: Diagnosis not present

## 2023-04-08 DIAGNOSIS — Z1331 Encounter for screening for depression: Secondary | ICD-10-CM | POA: Diagnosis not present

## 2023-04-08 DIAGNOSIS — M25512 Pain in left shoulder: Secondary | ICD-10-CM | POA: Diagnosis not present

## 2023-04-08 DIAGNOSIS — Z0000 Encounter for general adult medical examination without abnormal findings: Secondary | ICD-10-CM | POA: Diagnosis not present

## 2023-04-08 DIAGNOSIS — R7989 Other specified abnormal findings of blood chemistry: Secondary | ICD-10-CM | POA: Diagnosis not present

## 2023-04-08 DIAGNOSIS — Z23 Encounter for immunization: Secondary | ICD-10-CM | POA: Diagnosis not present

## 2023-04-08 DIAGNOSIS — Z1339 Encounter for screening examination for other mental health and behavioral disorders: Secondary | ICD-10-CM | POA: Diagnosis not present

## 2023-04-08 DIAGNOSIS — M5416 Radiculopathy, lumbar region: Secondary | ICD-10-CM | POA: Diagnosis not present

## 2023-04-08 DIAGNOSIS — E785 Hyperlipidemia, unspecified: Secondary | ICD-10-CM | POA: Diagnosis not present

## 2023-04-08 DIAGNOSIS — I700 Atherosclerosis of aorta: Secondary | ICD-10-CM | POA: Diagnosis not present

## 2023-04-08 DIAGNOSIS — Z1389 Encounter for screening for other disorder: Secondary | ICD-10-CM | POA: Diagnosis not present

## 2023-04-16 DIAGNOSIS — J0100 Acute maxillary sinusitis, unspecified: Secondary | ICD-10-CM | POA: Diagnosis not present

## 2023-04-16 DIAGNOSIS — R0981 Nasal congestion: Secondary | ICD-10-CM | POA: Diagnosis not present

## 2023-04-16 DIAGNOSIS — R058 Other specified cough: Secondary | ICD-10-CM | POA: Diagnosis not present

## 2023-04-19 ENCOUNTER — Other Ambulatory Visit: Payer: Self-pay | Admitting: Internal Medicine

## 2023-06-24 DIAGNOSIS — M19011 Primary osteoarthritis, right shoulder: Secondary | ICD-10-CM | POA: Diagnosis not present

## 2023-06-24 DIAGNOSIS — M19012 Primary osteoarthritis, left shoulder: Secondary | ICD-10-CM | POA: Diagnosis not present

## 2023-06-26 ENCOUNTER — Other Ambulatory Visit: Payer: Self-pay | Admitting: Internal Medicine

## 2023-08-05 DIAGNOSIS — M67911 Unspecified disorder of synovium and tendon, right shoulder: Secondary | ICD-10-CM | POA: Diagnosis not present

## 2023-08-05 DIAGNOSIS — M19012 Primary osteoarthritis, left shoulder: Secondary | ICD-10-CM | POA: Diagnosis not present

## 2023-09-06 ENCOUNTER — Ambulatory Visit: Payer: Medicare HMO | Admitting: Internal Medicine

## 2023-11-28 DIAGNOSIS — H11003 Unspecified pterygium of eye, bilateral: Secondary | ICD-10-CM | POA: Diagnosis not present

## 2023-11-28 DIAGNOSIS — H5203 Hypermetropia, bilateral: Secondary | ICD-10-CM | POA: Diagnosis not present

## 2023-11-28 DIAGNOSIS — H524 Presbyopia: Secondary | ICD-10-CM | POA: Diagnosis not present

## 2023-11-28 DIAGNOSIS — H2513 Age-related nuclear cataract, bilateral: Secondary | ICD-10-CM | POA: Diagnosis not present

## 2023-12-25 NOTE — Progress Notes (Unsigned)
 Cardiology Office Note   Date:  12/27/2023   ID:  Michael Compton, Michael Compton 01/20/1946, MRN [id number]  PCP:  Loreli Elsie JONETTA Mickey., MD  Cardiologist:   Vina Gull, MD   Patien referred for CAD and dyspnea       History of Present Illness: Michael Compton is a 78 y.o. male with hx of CAD, HL, HTN I saw him initially for DOE    Dec 2023   CCTA done Ca score was 1475    LAD mild diffuse  proximal dz; mod mid LAD dz with soft plaque   LCx  nondominant   Large OM1   FFR negative    Feb 2024 COntinued dyspnea.   GXT  Pt exercised 4 min  HR increased to 134 bpm   Noted occasional PVCs during exercise  Pt had  hypertensive response  BP 209/115   Based on this he was started on amlodipine  2.5 mg and hydrochlorothiazide  increased 25 mg     I saw the pt in Aug 2024  Since seen he says he feels good  He denies CP  Breathing is good   Occasional cough   Denies dizziness     PT Walks 1 to 2 miles   3x per week He golfs on other days Goes to  [employer]  Abs and core  PT says his BP at home is upper 120s to 150s        Current Meds  Medication Sig   amLODipine  (NORVASC ) 2.5 MG tablet TAKE 1 TABLET BY MOUTH EVERY DAY   Coenzyme Q10 (COQ10 PO) Take by mouth.   ezetimibe  (ZETIA ) 10 MG tablet TAKE 1 TABLET BY MOUTH EVERY DAY   hydrochlorothiazide  (HYDRODIURIL ) 25 MG tablet Take 1 tablet (25 mg total) by mouth daily.   ibuprofen (ADVIL,MOTRIN) 200 MG tablet Take 200 mg by mouth every 6 (six) hours as needed.   Krill Oil 300 MG CAPS Take 300 mg by mouth daily.   Lysine 1000 MG TABS Take 1,000 mg by mouth daily.   MAGNESIUM PO Take by mouth.   omeprazole  (PRILOSEC) 20 MG capsule Take 1 capsule (20 mg total) by mouth daily.   rosuvastatin  (CRESTOR ) 20 MG tablet TAKE 1 TABLET BY MOUTH EVERY DAY   sildenafil  (REVATIO ) 20 MG tablet 1-2 tablets 2-3 hours prior to sex   tamsulosin  (FLOMAX ) 0.4 MG CAPS capsule Take 1 capsule (0.4 mg total) by mouth daily.   Testosterone  1.62 % GEL 2 shots daily, one shot  each arm   tiZANidine (ZANAFLEX) 4 MG capsule Take 4 mg by mouth every 6 (six) hours as needed for muscle spasms.   TURMERIC CURCUMIN PO Take by mouth daily.     Allergies:   Simvastatin    Past Medical History:  Diagnosis Date   Bradycardia    DISC DISEASE, CERVICAL 11/15/2008   ERECTILE DYSFUNCTION 11/14/2007   GERD (gastroesophageal reflux disease)    on meds   HYPERLIPIDEMIA 11/14/2007   Hypertension    Nephrolithiasis     Past Surgical History:  Procedure Laterality Date   BELPHAROPTOSIS REPAIR     COLONOSCOPY  2016   TAx1   COLONOSCOPY  2005   SSP   CYSTOSCOPY/URETEROSCOPY/HOLMIUM LASER/STENT PLACEMENT Left 04/29/2018   Procedure: CYSTOSCOPY/LEFT URETEROSCOPY/HOLMIUM LASER/STENT PLACEMENT;  Surgeon: Carolee Sherwood JONETTA DOUGLAS, MD;  Location: [facility];  Service: Urology;  Laterality: Left;   FINGER SURGERY     HERNIA REPAIR     KNEE ARTHROSCOPY  POLYPECTOMY  2016   TA x 1     Social History:  The patient  reports that he has been smoking cigars. He has never used smokeless tobacco. He reports current alcohol  use of about 7.0 standard drinks of alcohol  per week. He reports that he does not use drugs.   Family History:  The patient's family history includes Bladder Cancer (age of onset: 44) in his father; Heart failure in his mother; Stomach cancer (age of onset: 69) in his father.    ROS:  Please see the history of present illness. All other systems are reviewed and  Negative to the above problem except as noted.    PHYSICAL EXAM: VS:  BP 138/87 (BP Location: Left Arm, Patient Position: Sitting)   Pulse 66   Ht 5' 9 (1.753 m)   Wt 206 lb 6.4 oz (93.6 kg)   SpO2 95%   BMI 30.48 kg/m   GEN: Well nourished, well developed, in no acute distress  HEENT: normal  Neck: no JVD, carotid bruits Cardiac: RRR; no murmur,  No LE  edema  Respiratory:  clear to auscultation, GI: soft, nontender,No hepatomegaly    EKG:  EKG shows ectopic atrial rhythm 66 bpm   Occasional  PACs   CT FFR  Dec 2023   1. Left Main: FFR = 0.99   2. LAD: Proximal FFR = 0.99, mid FFR = 0.86, distal FFR = 0.80 3. LCX: Proximal FFR = 0.99, distal FFR = not analyzed 4. RCA: Proximal FFR = 0.99, mid FFR =0.99, distal FFR = 0.90   IMPRESSION: 1.  CT FFR analysis showed no significant stenosis.   CCTA  05/09/22  Aorta: Normal size. There is mild aortic root calcification. No dissection.   Aortic Valve:  Trileaflet.  No calcifications.   Coronary Arteries: Normal coronary origin. Right dominance. RCA is a large dominant artery that gives rise to PDA and PLA. There is a misregistration artifact that make visibility poor in the RCA. However there diffuse mild calcification throughout the vessel.   Left main is a large artery that gives rise to LAD and LCX arteries.   LAD is a large vessel. The proximal LAD with diffuse mild (25-49%) calcified plaques. The mid LAD for have a moderate (50-69%) sized soft plaque. In the mid to distal LAD is a focal calcified plaque. Minimal calcification the distal LAD.   LCX is a non-dominant artery that gives rise to one large OM1 branch. There is no plaque.   Coronary Calcium  Score: 1475   Left main: 0 Left anterior descending artery: 191  Left circumflex artery: 0  Right coronary artery: 1283  Total: 1475  Percentile: 90    IMPRESSION: 1. Coronary calcium  score of 1475. This was 90 percentile for age and sex matched control.   2. Normal coronary origin with right dominance.    Lipid Panel    Component Value Date/Time   CHOL 175 05/06/2017 1113   TRIG 138.0 05/06/2017 1113   HDL 42.20 05/06/2017 1113   CHOLHDL 4 05/06/2017 1113   VLDL 27.6 05/06/2017 1113   LDLCALC 105 (H) 05/06/2017 1113   LDLDIRECT 153.9 02/17/2013 1153      Wt Readings from Last 3 Encounters:  12/27/23 206 lb 6.4 oz (93.6 kg)  01/04/23 206 lb (93.4 kg)  07/06/22 214 lb (97.1 kg)      ASSESSMENT AND PLAN:  1 CAD   Pt with no hx of CP  CCTA  in Dec 2023 showd mild  to moderate dz, mostly in LAD   FFR negative   He continues to deny CP  NO SOB     His dyspnea on exertion improved with better BP control     Follow   Rx risk factors  2  HTN   BP a little high today   High at home    I have asked him to increase amlodipine  to 5 mg per day    Call/write in on my chart in 4 wks for what readings are  Goal 110s to low 130s most of time   3  HL   Will  get NMR panel  Check A1C, Vit D CMET and TSH today    Follow up in July or sooner if worsening symptoms     Current medicines are reviewed at length with the patient today.  The patient does not have concerns regarding medicines.  Signed, Vina Gull, MD  12/27/2023 8:38 AM    [facility] [address] [place], [place], [place]  [address] Phone: [phone]; Fax: [phone]

## 2023-12-27 ENCOUNTER — Ambulatory Visit: Attending: Cardiology | Admitting: Internal Medicine

## 2023-12-27 ENCOUNTER — Encounter: Payer: Self-pay | Admitting: Internal Medicine

## 2023-12-27 VITALS — BP 138/87 | HR 66 | Ht 69.0 in | Wt 206.4 lb

## 2023-12-27 DIAGNOSIS — R931 Abnormal findings on diagnostic imaging of heart and coronary circulation: Secondary | ICD-10-CM | POA: Diagnosis not present

## 2023-12-27 DIAGNOSIS — Z79899 Other long term (current) drug therapy: Secondary | ICD-10-CM

## 2023-12-27 DIAGNOSIS — E785 Hyperlipidemia, unspecified: Secondary | ICD-10-CM | POA: Diagnosis not present

## 2023-12-27 MED ORDER — AMLODIPINE BESYLATE 5 MG PO TABS: 5.0000 mg | ORAL_TABLET | Freq: Every day | ORAL | Status: AC

## 2023-12-27 NOTE — Patient Instructions (Signed)
 Medication Instructions:  Your physician has recommended you make the following change in your medication:  1) INCREASE amlodipine  to 5 mg daily (send us  an update with blood pressure readings via MyChart in 4 weeks)  *If you need a refill on your cardiac medications before your next appointment, please call your pharmacy*  Lab Work: TODAY: NMR, CMET, Hgb A1c, CBC, TSH If you have labs (blood work) drawn today and your tests are completely normal, you will receive your results only by: MyChart Message (if you have MyChart) OR A paper copy in the mail If you have any lab test that is abnormal or we need to change your treatment, we will call you to review the results.  Follow-Up: At [facility], you and your health needs are our priority.  As part of our continuing mission to provide you with exceptional heart care, our providers are all part of one team.  This team includes your primary Cardiologist (physician) and Advanced Practice Providers or APPs (Physician Assistants and Nurse Practitioners) who all work together to provide you with the care you need, when you need it.  Your next appointment:   9 month(s)  Provider:   Vina Gull, MD   We recommend signing up for the patient portal called MyChart.  Sign up information is provided on this After Visit Summary.  MyChart is used to connect with patients for Virtual Visits (Telemedicine).  Patients are able to view lab/test results, encounter notes, upcoming appointments, etc.  Non-urgent messages can be sent to your provider as well.    To learn more about what you can do with MyChart, go to [email or web address].

## 2023-12-28 LAB — COMPREHENSIVE METABOLIC PANEL WITH GFR
ALT: 36 IU/L (ref 0–44)
AST: 31 IU/L (ref 0–40)
Albumin: 4.3 g/dL (ref 3.8–4.8)
Alkaline Phosphatase: 62 IU/L (ref 44–121)
BUN/Creatinine Ratio: 17 (ref 10–24)
BUN: 18 mg/dL (ref 8–27)
Bilirubin Total: 0.7 mg/dL (ref 0.0–1.2)
CO2: 22 mmol/L (ref 20–29)
Calcium: 9 mg/dL (ref 8.6–10.2)
Chloride: 104 mmol/L (ref 96–106)
Creatinine, Ser: 1.07 mg/dL (ref 0.76–1.27)
Globulin, Total: 2.2 g/dL (ref 1.5–4.5)
Glucose: 99 mg/dL (ref 70–99)
Potassium: 4.5 mmol/L (ref 3.5–5.2)
Sodium: 140 mmol/L (ref 134–144)
Total Protein: 6.5 g/dL (ref 6.0–8.5)
eGFR: 71 mL/min/1.73 (ref >59)

## 2023-12-28 LAB — NMR, LIPOPROFILE
Cholesterol, Total: 125 mg/dL (ref 100–199)
HDL Particle Number: 38.6 umol/L (ref >=30.5)
HDL-C: 54 mg/dL (ref >39)
LDL Particle Number: 620 nmol/L (ref <1000)
LDL Size: 20.1 nm — ABNORMAL LOW (ref >20.5)
LDL-C (NIH Calc): 57 mg/dL (ref 0–99)
LP-IR Score: 37 (ref <=45)
Small LDL Particle Number: 473 nmol/L (ref <=527)
Triglycerides: 70 mg/dL (ref 0–149)

## 2023-12-28 LAB — CBC
Hematocrit: 42.5 % (ref 37.5–51.0)
Hemoglobin: 14.1 g/dL (ref 13.0–17.7)
MCH: 32.4 pg (ref 26.6–33.0)
MCHC: 33.2 g/dL (ref 31.5–35.7)
MCV: 98 fL — ABNORMAL HIGH (ref 79–97)
Platelets: 255 x10E3/uL (ref 150–450)
RBC: 4.35 x10E6/uL (ref 4.14–5.80)
RDW: 12.1 % (ref 11.6–15.4)
WBC: 6.6 x10E3/uL (ref 3.4–10.8)

## 2023-12-28 LAB — HEMOGLOBIN A1C
Est. average glucose Bld gHb Est-mCnc: 114 mg/dL
Hgb A1c MFr Bld: 5.6 % (ref 4.8–5.6)

## 2023-12-28 LAB — TSH: TSH: 3.06 u[IU]/mL (ref 0.450–4.500)

## 2023-12-30 ENCOUNTER — Ambulatory Visit: Payer: Self-pay | Admitting: Internal Medicine

## 2023-12-31 ENCOUNTER — Encounter: Payer: Self-pay | Admitting: Internal Medicine

## 2024-01-08 ENCOUNTER — Other Ambulatory Visit: Payer: Self-pay | Admitting: Internal Medicine

## 2024-02-05 DIAGNOSIS — L820 Inflamed seborrheic keratosis: Secondary | ICD-10-CM | POA: Diagnosis not present

## 2024-02-05 DIAGNOSIS — C44729 Squamous cell carcinoma of skin of left lower limb, including hip: Secondary | ICD-10-CM | POA: Diagnosis not present

## 2024-02-05 DIAGNOSIS — Z85828 Personal history of other malignant neoplasm of skin: Secondary | ICD-10-CM | POA: Diagnosis not present

## 2024-04-29 DIAGNOSIS — I10 Essential (primary) hypertension: Secondary | ICD-10-CM | POA: Diagnosis not present

## 2024-06-18 ENCOUNTER — Telehealth: Payer: Self-pay | Admitting: Internal Medicine

## 2024-06-19 NOTE — Telephone Encounter (Signed)
"   No answer  need verify what does patient is taking On  12/27/23 patient was increase to 5 mg Amlodipine  daily      This Rx is for 2.5 mg daily  "

## 2024-06-23 NOTE — Telephone Encounter (Signed)
 Spoke with patient to clarify the dose of amlodipine  he is taking.  Patient states he did not increase his dose as recommended at last OV in August. He has continued on amlodipine  2.5 mg daily.  Recent BP readings: 123/75 110/67 132/78 126/84 130/79 124/83  Patient states he has about 2 weeks left of amlodipine . Requests refill to be sent to [facility].  Will forward to Dr. Okey to review.

## 2024-06-23 NOTE — Telephone Encounter (Signed)
 Patient is returning call

## 2024-09-22 ENCOUNTER — Ambulatory Visit: Admitting: Internal Medicine
